# Patient Record
Sex: Female | Born: 1940
Health system: Southern US, Community
[De-identification: ages and names within clinical notes are randomized; demographics above are authoritative.]

## PROBLEM LIST (undated history)

## (undated) DIAGNOSIS — M1990 Unspecified osteoarthritis, unspecified site: Secondary | ICD-10-CM

## (undated) DIAGNOSIS — E079 Disorder of thyroid, unspecified: Secondary | ICD-10-CM

## (undated) DIAGNOSIS — G709 Myoneural disorder, unspecified: Secondary | ICD-10-CM

## (undated) DIAGNOSIS — M810 Age-related osteoporosis without current pathological fracture: Secondary | ICD-10-CM

## (undated) DIAGNOSIS — E785 Hyperlipidemia, unspecified: Secondary | ICD-10-CM

## (undated) HISTORY — DX: Hyperlipidemia, unspecified: E78.5

## (undated) HISTORY — PX: OTHER SURGICAL HISTORY: SHX169

## (undated) HISTORY — DX: Unspecified osteoarthritis, unspecified site: M19.90

## (undated) HISTORY — DX: Disorder of thyroid, unspecified: E07.9

## (undated) HISTORY — DX: Myoneural disorder, unspecified: G70.9

## (undated) HISTORY — DX: Age-related osteoporosis without current pathological fracture: M81.0

---

## 1970-06-13 HISTORY — PX: ECTOPIC PREGNANCY SURGERY: SHX613

## 2010-11-10 ENCOUNTER — Other Ambulatory Visit: Payer: Self-pay | Admitting: *Deleted

## 2010-11-10 DIAGNOSIS — M25551 Pain in right hip: Secondary | ICD-10-CM

## 2010-11-11 ENCOUNTER — Ambulatory Visit
Admission: RE | Admit: 2010-11-11 | Discharge: 2010-11-11 | Disposition: A | Discharge status: Home / Self Care | Payer: Medicare Other | Source: Ambulatory Visit | Attending: *Deleted | Admitting: *Deleted

## 2010-11-11 DIAGNOSIS — M25551 Pain in right hip: Secondary | ICD-10-CM

## 2012-02-09 LAB — CBC AND DIFFERENTIAL
HCT: 37 (ref 36–46)
Hemoglobin: 12.2 (ref 12.0–16.0)
Platelets: 176 (ref 150–399)
WBC: 4.1

## 2012-02-09 LAB — BASIC METABOLIC PANEL
BUN: 17 (ref 4–21)
Creatinine: 0.9 (ref 0.5–1.1)

## 2012-02-09 LAB — HEPATIC FUNCTION PANEL
ALT: 10 (ref 7–35)
AST: 18 (ref 13–35)
Alkaline Phosphatase: 98 (ref 25–125)
Bilirubin, Total: 0.5

## 2012-05-28 ENCOUNTER — Other Ambulatory Visit: Payer: Self-pay | Admitting: Rheumatology

## 2012-05-28 DIAGNOSIS — M549 Dorsalgia, unspecified: Secondary | ICD-10-CM

## 2012-12-19 LAB — LIPID PANEL
Cholesterol: 215 mg/dL — AB (ref 0–200)
HDL: 94 mg/dL — AB (ref 35–70)
LDL Cholesterol: 106 mg/dL
Triglycerides: 76 mg/dL (ref 40–160)

## 2012-12-19 LAB — CBC AND DIFFERENTIAL
HCT: 39 % (ref 36–46)
Hemoglobin: 12.8 g/dL (ref 12.0–16.0)

## 2012-12-19 LAB — BASIC METABOLIC PANEL
BUN: 13 mg/dL (ref 4–21)
Creatinine: 0.9 mg/dL (ref 0.5–1.1)

## 2013-10-14 ENCOUNTER — Ambulatory Visit (INDEPENDENT_AMBULATORY_CARE_PROVIDER_SITE_OTHER): Payer: Medicare Other | Admitting: Podiatry

## 2013-10-14 ENCOUNTER — Encounter: Payer: Self-pay | Admitting: Podiatry

## 2013-10-14 VITALS — BP 155/82 | HR 54 | Resp 15 | Ht 67.0 in | Wt 149.0 lb

## 2013-10-14 DIAGNOSIS — L600 Ingrowing nail: Secondary | ICD-10-CM

## 2013-10-14 NOTE — Patient Instructions (Signed)
Contact [facility] to evaluate skin color change on the distal left great toe.

## 2013-10-14 NOTE — Progress Notes (Signed)
   Subjective:    Patient ID: Courtney Ayala, female    DOB: 07/08/1940, 73 y.o.   MRN: [id number]  HPI Comments: N toenail problems L right 1st toenail, left 1st and 2nd toenails D 6 months right 1st toenail has been tender and loose from the nailbed     2 months left1st and 2nd toenail are discolored and thickened, with a vertical strip and a dark spot at the left 1st toe tip O C right 1st is tender and lifting from the toenail bed A possible polish related T none   Patient states that the skin: The distal left hallux was noted about 2 years ago, however, she thinks that the size is increasing more recently  Review of Systems  Eyes: Positive for pain.  Musculoskeletal: Positive for arthralgias and back pain.  Skin:       Change in nails of the hand texture and color  Allergic/Immunologic: Positive for environmental allergies.  All other systems reviewed and are negative.      Objective:   Physical Exam  Orientated x3 space black female  Vascular: DP and PT pulses 2/4 bilaterally  Neurological: Sensation to 10 g monofilament wire intact 5/5 bilaterally Vibratory sensation intact bilaterally Ankle reflexes reactive bilaterally  Dermatological: A circular black 2 mm pigmented lesion nonraised noted on the distal left hallux. Borders appear regular. There is no bleeding. The distal right hallux toenail demonstrates some mild color changes without any subungual debris. The lateral margin right hallux toenail is mildly tender without any drainage noted.  Musculoskeletal Bilateral HAV deformities noted The right hallux is rubbing against the lateral margin of the second toe which is probably the cause of the patient's occasional discomfort in the area        Assessment & Plan:   Assessment: Satisfactory neurovascular status Pigmented lesion distal left hallux that needs further evaluation Dystrophic changes in the left hallux that may be associated from toenail  polish Ingrowing margin of the left hallux nail  Plan: Patient is advised to contact [facility] as soon as possible to arrange an evaluation of the pigmented lesion on the distal aspect of the left hallux.  No treatment recommended for the nail dystrophic changes and mildly ingrowing left hallux nail.

## 2013-10-15 ENCOUNTER — Encounter: Payer: Self-pay | Admitting: Podiatry

## 2014-06-26 DIAGNOSIS — Z79899 Other long term (current) drug therapy: Secondary | ICD-10-CM | POA: Diagnosis not present

## 2014-06-26 DIAGNOSIS — E785 Hyperlipidemia, unspecified: Secondary | ICD-10-CM | POA: Diagnosis not present

## 2014-06-26 DIAGNOSIS — I10 Essential (primary) hypertension: Secondary | ICD-10-CM | POA: Diagnosis not present

## 2014-06-26 DIAGNOSIS — M150 Primary generalized (osteo)arthritis: Secondary | ICD-10-CM | POA: Diagnosis not present

## 2014-06-26 DIAGNOSIS — E039 Hypothyroidism, unspecified: Secondary | ICD-10-CM | POA: Diagnosis not present

## 2014-06-26 DIAGNOSIS — K219 Gastro-esophageal reflux disease without esophagitis: Secondary | ICD-10-CM | POA: Diagnosis not present

## 2014-09-26 DIAGNOSIS — M150 Primary generalized (osteo)arthritis: Secondary | ICD-10-CM | POA: Diagnosis not present

## 2014-09-26 DIAGNOSIS — E785 Hyperlipidemia, unspecified: Secondary | ICD-10-CM | POA: Diagnosis not present

## 2014-09-26 DIAGNOSIS — I10 Essential (primary) hypertension: Secondary | ICD-10-CM | POA: Diagnosis not present

## 2014-09-26 DIAGNOSIS — K219 Gastro-esophageal reflux disease without esophagitis: Secondary | ICD-10-CM | POA: Diagnosis not present

## 2014-09-26 DIAGNOSIS — Z79899 Other long term (current) drug therapy: Secondary | ICD-10-CM | POA: Diagnosis not present

## 2014-09-26 DIAGNOSIS — E039 Hypothyroidism, unspecified: Secondary | ICD-10-CM | POA: Diagnosis not present

## 2014-09-26 DIAGNOSIS — R739 Hyperglycemia, unspecified: Secondary | ICD-10-CM | POA: Diagnosis not present

## 2014-09-29 LAB — HEMOGLOBIN A1C: Hemoglobin A1C: 5.9

## 2014-12-22 ENCOUNTER — Encounter: Payer: Self-pay | Admitting: Family Medicine

## 2015-02-02 ENCOUNTER — Telehealth: Payer: Self-pay | Admitting: Behavioral Health

## 2015-02-02 NOTE — Telephone Encounter (Signed)
Per the patient, she will call the office a week from now to reschedule for  another appointment.

## 2015-02-03 ENCOUNTER — Ambulatory Visit: Payer: Self-pay | Admitting: Family Medicine

## 2015-02-12 LAB — HM MAMMOGRAPHY: HM Mammogram: NORMAL (ref 0–4)

## 2015-03-12 DIAGNOSIS — E785 Hyperlipidemia, unspecified: Secondary | ICD-10-CM | POA: Diagnosis not present

## 2015-03-12 DIAGNOSIS — K219 Gastro-esophageal reflux disease without esophagitis: Secondary | ICD-10-CM | POA: Diagnosis not present

## 2015-03-12 DIAGNOSIS — Z79899 Other long term (current) drug therapy: Secondary | ICD-10-CM | POA: Diagnosis not present

## 2015-03-12 DIAGNOSIS — E039 Hypothyroidism, unspecified: Secondary | ICD-10-CM | POA: Diagnosis not present

## 2015-03-12 DIAGNOSIS — M150 Primary generalized (osteo)arthritis: Secondary | ICD-10-CM | POA: Diagnosis not present

## 2015-03-12 DIAGNOSIS — I10 Essential (primary) hypertension: Secondary | ICD-10-CM | POA: Diagnosis not present

## 2015-03-12 LAB — HEPATIC FUNCTION PANEL
ALT: 9 U/L (ref 7–35)
AST: 16 U/L (ref 13–35)
Alkaline Phosphatase: 83 U/L (ref 25–125)

## 2015-03-12 LAB — TSH: TSH: 0.94 u[IU]/mL (ref 0.41–5.90)

## 2015-03-12 LAB — BASIC METABOLIC PANEL
BUN: 13 mg/dL (ref 4–21)
Creatinine: 0.8 mg/dL (ref 0.5–1.1)
Potassium: 4.4 mmol/L (ref 3.4–5.3)
Sodium: 141 mmol/L (ref 137–147)

## 2015-03-12 LAB — HEMOGLOBIN A1C: Hemoglobin A1C: 5.9

## 2015-03-13 LAB — HEMOGLOBIN A1C: Hemoglobin A1C: 5.9

## 2015-03-13 LAB — TSH: TSH: 0.94 (ref 0.41–5.90)

## 2015-04-06 DIAGNOSIS — M25561 Pain in right knee: Secondary | ICD-10-CM | POA: Diagnosis not present

## 2015-04-06 DIAGNOSIS — M179 Osteoarthritis of knee, unspecified: Secondary | ICD-10-CM | POA: Diagnosis not present

## 2015-05-06 DIAGNOSIS — Z1231 Encounter for screening mammogram for malignant neoplasm of breast: Secondary | ICD-10-CM | POA: Diagnosis not present

## 2015-05-06 DIAGNOSIS — Z9189 Other specified personal risk factors, not elsewhere classified: Secondary | ICD-10-CM | POA: Diagnosis not present

## 2015-09-08 DIAGNOSIS — E785 Hyperlipidemia, unspecified: Secondary | ICD-10-CM | POA: Diagnosis not present

## 2015-09-08 DIAGNOSIS — K219 Gastro-esophageal reflux disease without esophagitis: Secondary | ICD-10-CM | POA: Diagnosis not present

## 2015-09-08 DIAGNOSIS — E039 Hypothyroidism, unspecified: Secondary | ICD-10-CM | POA: Diagnosis not present

## 2015-09-08 DIAGNOSIS — M150 Primary generalized (osteo)arthritis: Secondary | ICD-10-CM | POA: Diagnosis not present

## 2015-11-23 DIAGNOSIS — M25561 Pain in right knee: Secondary | ICD-10-CM | POA: Diagnosis not present

## 2015-11-23 DIAGNOSIS — M179 Osteoarthritis of knee, unspecified: Secondary | ICD-10-CM | POA: Diagnosis not present

## 2015-12-21 DIAGNOSIS — M25561 Pain in right knee: Secondary | ICD-10-CM | POA: Diagnosis not present

## 2015-12-21 DIAGNOSIS — M179 Osteoarthritis of knee, unspecified: Secondary | ICD-10-CM | POA: Diagnosis not present

## 2016-01-08 DIAGNOSIS — K219 Gastro-esophageal reflux disease without esophagitis: Secondary | ICD-10-CM | POA: Diagnosis not present

## 2016-01-08 DIAGNOSIS — R079 Chest pain, unspecified: Secondary | ICD-10-CM | POA: Diagnosis not present

## 2016-01-08 DIAGNOSIS — E785 Hyperlipidemia, unspecified: Secondary | ICD-10-CM | POA: Diagnosis not present

## 2016-01-08 DIAGNOSIS — E039 Hypothyroidism, unspecified: Secondary | ICD-10-CM | POA: Diagnosis not present

## 2016-01-08 DIAGNOSIS — I10 Essential (primary) hypertension: Secondary | ICD-10-CM | POA: Diagnosis not present

## 2016-01-08 DIAGNOSIS — M150 Primary generalized (osteo)arthritis: Secondary | ICD-10-CM | POA: Diagnosis not present

## 2016-01-08 DIAGNOSIS — Z79899 Other long term (current) drug therapy: Secondary | ICD-10-CM | POA: Diagnosis not present

## 2016-01-09 LAB — HEPATIC FUNCTION PANEL
ALT: 12 (ref 7–35)
AST: 16 (ref 13–35)
Alkaline Phosphatase: 67 (ref 25–125)
Bilirubin, Total: 0.7

## 2016-01-09 LAB — BASIC METABOLIC PANEL
BUN: 13 (ref 4–21)
Creatinine: 0.9 (ref 0.5–1.1)
Glucose: 95
Potassium: 4.2 (ref 3.4–5.3)
Sodium: 142 (ref 137–147)

## 2016-01-09 LAB — LIPID PANEL
Cholesterol: 248 — AB (ref 0–200)
HDL: 137 — AB (ref 35–70)
LDL Cholesterol: 101
Triglycerides: 48 (ref 40–160)

## 2016-01-09 LAB — CBC AND DIFFERENTIAL
HCT: 40 (ref 36–46)
Hemoglobin: 13.1 (ref 12.0–16.0)
WBC: 3.8

## 2016-01-09 LAB — TSH: TSH: 0.45 (ref 0.41–5.90)

## 2016-03-14 DIAGNOSIS — H25813 Combined forms of age-related cataract, bilateral: Secondary | ICD-10-CM | POA: Diagnosis not present

## 2016-03-14 DIAGNOSIS — H04123 Dry eye syndrome of bilateral lacrimal glands: Secondary | ICD-10-CM | POA: Diagnosis not present

## 2016-03-14 DIAGNOSIS — H35363 Drusen (degenerative) of macula, bilateral: Secondary | ICD-10-CM | POA: Diagnosis not present

## 2016-03-30 ENCOUNTER — Encounter: Payer: Self-pay | Admitting: Obstetrics & Gynecology

## 2016-03-30 ENCOUNTER — Ambulatory Visit (INDEPENDENT_AMBULATORY_CARE_PROVIDER_SITE_OTHER): Payer: Medicare Other | Admitting: Obstetrics & Gynecology

## 2016-03-30 DIAGNOSIS — N8110 Cystocele, unspecified: Secondary | ICD-10-CM

## 2016-03-30 NOTE — Patient Instructions (Signed)

## 2016-03-30 NOTE — Progress Notes (Signed)
History:  75 y.o. G1P010 (EC x1) here today for eval of swelling in the vagina that she notes at the end of the day  She reports that it is not present in the am. It was initially noted 6 weeks prev.Pt denies full term pregnancy but, had an ectopic years prev.  Pt does not smoke and does not have a daily cough.  Pt reports occ pain in pelvics but, none related to his 'lump'   Pt denies leakage of urine She has been worried that she has cancer.  Pt had a mammogram last year..   Last Pelvic exam was in her 1750's   The following portions of the patient's history were reviewed and updated as appropriate: allergies, current medications, past family history, past medical history, past social history, past surgical history and problem list.  Review of Systems:  Pertinent items are noted in HPI.  Objective:  Physical Exam Blood pressure (!) 164/81, pulse (!) 57, height 5' 7.5" (1.715 m), weight 148 lb (67.1 kg). Gen: NAD Abd: Soft, nontender and nondistended Pelvic: Normal appearing external genitalia; normal appearing vaginal mucosa and cervix.  Normal discharge.  Small uterus, no other palpable masses, no uterine or adnexal tenderness  Labs and Imaging No results found.  Assessment & Plan:  Pelvic organ prolapse- grade I cystocele and grade I uterine prolapse Reviewed with pt natural history of prolapse.  Reviewed treatment options including pessary and surgery. I rec no treatment at present.  Will have pt f/ui in 6 moths r sooner if she notes changes.  Elevated blood pressure- pt reports that she was worried prior to this visit.  She will f/u with her primary care provider to have her BP rechecked.   Rec f/u in 6 months

## 2016-04-18 ENCOUNTER — Encounter: Payer: Medicare Other | Admitting: Obstetrics & Gynecology

## 2016-05-04 ENCOUNTER — Telehealth: Payer: Self-pay | Admitting: Behavioral Health

## 2016-05-04 ENCOUNTER — Encounter: Payer: Self-pay | Admitting: Behavioral Health

## 2016-05-04 NOTE — Telephone Encounter (Signed)
Pre-Visit Call completed with patient and chart updated.   Pre-Visit Info documented in Specialty Comments under SnapShot.    

## 2016-05-09 ENCOUNTER — Encounter: Payer: Self-pay | Admitting: Family Medicine

## 2016-05-09 ENCOUNTER — Ambulatory Visit (INDEPENDENT_AMBULATORY_CARE_PROVIDER_SITE_OTHER): Payer: Medicare Other | Admitting: Family Medicine

## 2016-05-09 VITALS — BP 128/72 | HR 55 | Temp 98.0°F | Ht 67.4 in | Wt 150.1 lb

## 2016-05-09 DIAGNOSIS — Z23 Encounter for immunization: Secondary | ICD-10-CM | POA: Diagnosis not present

## 2016-05-09 DIAGNOSIS — E785 Hyperlipidemia, unspecified: Secondary | ICD-10-CM | POA: Diagnosis not present

## 2016-05-09 DIAGNOSIS — E890 Postprocedural hypothyroidism: Secondary | ICD-10-CM | POA: Diagnosis not present

## 2016-05-09 NOTE — Progress Notes (Signed)
Pre visit review using our clinic review tool, if applicable. No additional management support is needed unless otherwise documented below in the visit note. 

## 2016-05-09 NOTE — Progress Notes (Signed)
[facility] [address], [address] [place], [place] [id number] [phone] [phone]  Date:  05/09/2016   Name:  Courtney Ayala   DOB:  11/02/1940   MRN:  [id number]  PCP:  Pcp Not In System    Chief Complaint: Establish Care   History of Present Illness:  Courtney MonksMyrtle Gellert is a 75 y.o. very pleasant female patient who presents with the following:  Here today as a new patient to establish care; she recently moved back to this part of the state. Her husband is a pt of Dr. Myna HidalgoEnnever.  [place],    She has OA in her knees.  She uses meloxicam as needed for this problem She also uses thyroid med; she had radioactive iodine for hyperthyroidism a few years ago She is also on cholesterol medication  She last had blood tests about 4 months ago she thinks per her last PCP   Her husband is ill but is making the best of things; he is enjoying life as much as she can She has 2 step- children by her husband's previous marriage.  She herself miscarried once but otherwise has not been able to become pregnant or have children.  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   She would like to get her flu shot today  She had her last mammo in November of 2016- she has this scheduled for next week She is overall feeling well, thinks that her health care is UTD but would like for me to go over her labs, vacs, etc and do any needed updates at our next visit   Patient Active Problem List   Diagnosis Date Noted  . Pelvic organ prolapse quantification stage 1 cystocele 03/30/2016    Past Medical History:  Diagnosis Date  . Arthritis    B/L knees  . Hyperlipidemia   . Neuromuscular disorder (HCC)   . Thyroid disease     Past Surgical History:  Procedure Laterality Date  . ECTOPIC PREGNANCY SURGERY  1972    Social History  Substance Use Topics  . Smoking status: Never  Smoker  . Smokeless tobacco: Never Used  . Alcohol use No    Family History  Problem Relation Age of Onset  . Hypertension Maternal Grandmother   . Cancer Neg Hx   . Stroke Neg Hx   . Diabetes Neg Hx   . Colon cancer Neg Hx   . Breast cancer Neg Hx     No Known Allergies  Medication list has been reviewed and updated.  Current Outpatient Prescriptions on File Prior to Visit  Medication Sig Dispense Refill  . atorvastatin (LIPITOR) 10 MG tablet Take 10 mg by mouth daily.    Marland Kitchen. levothyroxine (SYNTHROID, LEVOTHROID) 88 MCG tablet Take 88 mcg by mouth daily before breakfast.     No current facility-administered medications on file prior to visit.     Review of Systems:  As per HPI- otherwise negative.  Noted mild bradycardia- per pt no CP, SOB, palpitations.  She is quite active but is not sure if she has been bradycardic in the past. However looking back at limited past pulse readings this appears to be her baseline   Pulse Readings from Last 3 Encounters:  05/09/16 (!) 55  03/30/16 (!) 57  10/14/13 (!) 54      Physical Examination: Vitals:  05/09/16 1409  BP: 128/72  Pulse: (!) 55  Temp: 98 F (36.7 C)   Vitals:   05/09/16 1409  Weight: 150 lb 2 oz (68.1 kg)  Height: 5' 7.4" (1.712 m)   Body mass index is 23.23 kg/m. Ideal Body Weight: Weight in (lb) to have BMI = 25: 161.2  GEN: WDWN, NAD, Non-toxic, A & O x 3, looks well HEENT: Atraumatic, Normocephalic. Neck supple. No masses, No LAD. Ears and Nose: No external deformity. CV: RRR, No M/G/R. No JVD. No thrill. No extra heart sounds.  Mild bradycardia PULM: CTA B, no wheezes, crackles, rhonchi. No retractions. No resp. distress. No accessory muscle use. ABD: S, NT, ND, +BS. No rebound. No HSM. EXTR: No c/c/e NEURO Normal gait.  PSYCH: Normally interactive. Conversant. Not depressed or anxious appearing.  Calm demeanor.    Assessment and Plan:  Dyslipidemia Postablative hypothyroidism  Here today  to establish care and go over her health history She is on thyroid replacement and lipid treatment Will request her records from prior PCP and plan to see her in 2-3 months  Signed Abbe AmsterdamJessica Danity Schmelzer, MD

## 2016-05-09 NOTE — Patient Instructions (Addendum)
It was lovely to meet you today We will request records from your last doctor and I will transfer the information into your chart Please come and see me in about 3 months; we can do a physical and update any labs/ immunizations that are needed at that visit

## 2016-06-22 DIAGNOSIS — M25561 Pain in right knee: Secondary | ICD-10-CM | POA: Diagnosis not present

## 2016-06-22 DIAGNOSIS — M179 Osteoarthritis of knee, unspecified: Secondary | ICD-10-CM | POA: Diagnosis not present

## 2016-06-22 DIAGNOSIS — M170 Bilateral primary osteoarthritis of knee: Secondary | ICD-10-CM | POA: Diagnosis not present

## 2016-07-25 DIAGNOSIS — M25561 Pain in right knee: Secondary | ICD-10-CM | POA: Diagnosis not present

## 2016-07-25 DIAGNOSIS — M179 Osteoarthritis of knee, unspecified: Secondary | ICD-10-CM | POA: Diagnosis not present

## 2016-08-18 ENCOUNTER — Telehealth: Payer: Self-pay | Admitting: Family Medicine

## 2016-08-18 ENCOUNTER — Other Ambulatory Visit: Payer: Self-pay | Admitting: Emergency Medicine

## 2016-08-18 MED ORDER — ATORVASTATIN CALCIUM 10 MG PO TABS: 10.0000 mg | ORAL_TABLET | Freq: Every day | ORAL | 0 refills | Status: DC

## 2016-08-18 NOTE — Telephone Encounter (Signed)
Caller name: Diamantina MonksMyrtle Ayala Relationship to patient: self Can be reached: [phone]  Pharmacy: [facility] [address] - [place], [place] - [address] [phone] (Phone) [phone] (Fax)   Reason for call: pt came in office requesting appt for cpe and refill on atorvastatin. Pt is completely out of meds. She took last dose 08/17/16 in evening. [facility] to [facility].

## 2016-08-18 NOTE — Telephone Encounter (Signed)
Refill sent per pt request.  

## 2016-08-22 ENCOUNTER — Telehealth: Payer: Self-pay | Admitting: *Deleted

## 2016-08-22 NOTE — Telephone Encounter (Signed)
Pt states she will schedule awv at a later date.

## 2016-08-24 ENCOUNTER — Encounter: Payer: Self-pay | Admitting: Family Medicine

## 2016-08-24 ENCOUNTER — Other Ambulatory Visit: Payer: Self-pay | Admitting: Family Medicine

## 2016-08-24 ENCOUNTER — Ambulatory Visit (INDEPENDENT_AMBULATORY_CARE_PROVIDER_SITE_OTHER): Payer: Medicare Other | Admitting: Family Medicine

## 2016-08-24 VITALS — BP 160/90 | HR 56 | Temp 98.6°F | Ht 67.4 in | Wt 148.0 lb

## 2016-08-24 DIAGNOSIS — E785 Hyperlipidemia, unspecified: Secondary | ICD-10-CM

## 2016-08-24 DIAGNOSIS — R030 Elevated blood-pressure reading, without diagnosis of hypertension: Secondary | ICD-10-CM

## 2016-08-24 DIAGNOSIS — R7303 Prediabetes: Secondary | ICD-10-CM | POA: Diagnosis not present

## 2016-08-24 DIAGNOSIS — Z1211 Encounter for screening for malignant neoplasm of colon: Secondary | ICD-10-CM | POA: Diagnosis not present

## 2016-08-24 DIAGNOSIS — Z1239 Encounter for other screening for malignant neoplasm of breast: Secondary | ICD-10-CM

## 2016-08-24 DIAGNOSIS — Z1231 Encounter for screening mammogram for malignant neoplasm of breast: Secondary | ICD-10-CM

## 2016-08-24 DIAGNOSIS — Z130 Encounter for screening for diseases of the blood and blood-forming organs and certain disorders involving the immune mechanism: Secondary | ICD-10-CM

## 2016-08-24 DIAGNOSIS — R7309 Other abnormal glucose: Secondary | ICD-10-CM

## 2016-08-24 DIAGNOSIS — E890 Postprocedural hypothyroidism: Secondary | ICD-10-CM

## 2016-08-24 DIAGNOSIS — Z5181 Encounter for therapeutic drug level monitoring: Secondary | ICD-10-CM

## 2016-08-24 MED ORDER — LEVOTHYROXINE SODIUM 88 MCG PO TABS: 88.0000 ug | ORAL_TABLET | Freq: Every day | ORAL | 3 refills | Status: DC

## 2016-08-24 MED ORDER — ATORVASTATIN CALCIUM 10 MG PO TABS: 10.0000 mg | ORAL_TABLET | Freq: Every day | ORAL | 3 refills | Status: DC

## 2016-08-24 NOTE — Progress Notes (Signed)
Pre visit review using our clinic tool,if applicable. No additional management support is needed unless otherwise documented below in the visit note.  

## 2016-08-24 NOTE — Patient Instructions (Addendum)
We will check your labs today and I will be in touch with your resutls asap We will arrange for you to have cologaurd testing to screen for colon cancer  Please stop by the imaging department on the ground floor to schedule your mammogram asap   Please monitor your blood pressure at home and contact me in about 2 weeks with some readings

## 2016-08-24 NOTE — Progress Notes (Addendum)
[facility] [address], [address] [place], [place] [address] [phone]  Date:  08/24/2016   Name:  Courtney Ayala   DOB:  [birth year]   MRN:  [id number]  PCP:  Abbe Amsterdam, MD    Chief Complaint: Annual Exam   History of Present Illness:  Courtney Ayala is a 76 y.o. very pleasant female patient who presents with the following:  Last seen by myself in November for hyperlipidemia. Today would like to check on her progress and do labs as needed She will return for her MWE - RN visit She needs her labs done today Also due for a thyroid level Her last FOBT was a few years ago- she would like cologuard as she is not sure of the date of her last colon cancer screening otherwise  BP Readings from Last 3 Encounters:  08/24/16 (!) 160/90  05/09/16 128/72  03/30/16 (!) 164/81   She has been fasting for the last 8 hours or so Her last bone density was 3-4 years ago She is faithfully taking her thyroid medication  Her BP is elevated today- she is not on BP medication and her BP looked fine at last visit.  She is able to check her BP at home and would like to monitor this for a while prior to considering medication  Noted to have an A1c in pre-diabetes range in 2016- will repeat for her today  Patient Active Problem List   Diagnosis Date Noted  . Postablative hypothyroidism 05/09/2016  . Dyslipidemia 05/09/2016  . Pelvic organ prolapse quantification stage 1 cystocele 03/30/2016    Past Medical History:  Diagnosis Date  . Arthritis    B/L knees  . Hyperlipidemia   . Neuromuscular disorder (HCC)   . Thyroid disease     Past Surgical History:  Procedure Laterality Date  . ECTOPIC PREGNANCY SURGERY  1972    Social History  Substance Use Topics  . Smoking status: Never Smoker  . Smokeless tobacco: Never Used  . Alcohol use No    Family History  Problem Relation Age of Onset  . Hypertension Maternal Grandmother   .  Cancer Neg Hx   . Stroke Neg Hx   . Diabetes Neg Hx   . Colon cancer Neg Hx   . Breast cancer Neg Hx     No Known Allergies  Medication list has been reviewed and updated.  Current Outpatient Prescriptions on File Prior to Visit  Medication Sig Dispense Refill  . meloxicam (MOBIC) 7.5 MG tablet Take 7.5 mg by mouth daily as needed for pain.     No current facility-administered medications on file prior to visit.     Review of Systems:  As per HPI- otherwise negative.   Physical Examination: Vitals:   08/24/16 1401 08/24/16 1625  BP: (!) 174/71 (!) 160/90  Pulse: (!) 56   Temp: 98.6 F (37 C)    Vitals:   08/24/16 1401  Weight: 148 lb (67.1 kg)  Height: 5' 7.4" (1.712 m)   Body mass index is 22.91 kg/m. Ideal Body Weight: Weight in (lb) to have BMI = 25: 161.2  GEN: WDWN, NAD, Non-toxic, A & O x 3, normal weight, looks well HEENT: Atraumatic, Normocephalic. Neck supple. No masses, No LAD.  Bilateral TM wnl, oropharynx normal.  PEERL,EOMI.   Ears and Nose: No external deformity. CV: RRR, No M/G/R. No JVD. No thrill. No extra heart sounds. PULM: CTA B, no wheezes,  crackles, rhonchi. No retractions. No resp. distress. No accessory muscle use. ABD: S, NT, ND EXTR: No c/c/e NEURO Normal gait.  PSYCH: Normally interactive. Conversant. Not depressed or anxious appearing.  Calm demeanor.    Assessment and Plan: Dyslipidemia - Plan: Lipid panel, atorvastatin (LIPITOR) 10 MG tablet  Postablative hypothyroidism - Plan: TSH, levothyroxine (SYNTHROID, LEVOTHROID) 88 MCG tablet  Elevated hemoglobin A1c - Plan: Comprehensive metabolic panel, Hemoglobin A1c  Screening for deficiency anemia - Plan: CBC  Screening for colon cancer  Screening for breast cancer  Medication monitoring encounter - Plan: CBC  Elevated blood pressure reading  Here today for a follow up visit Labs pending as above Refilled her cholesterol and thyroid med cologuard ordered  today Encouraged mammogram asap She will monitor her BP and contact with me some readings in about 2 weeks.  If consistently high will start on a medication likely ace   Signed Abbe AmsterdamJessica Copland, MD  Received her cholesterol numbers- ? Had she been off her cholesterol med.  Will send her a message  Results for orders placed or performed in visit on 08/24/16  CBC  Result Value Ref Range   WBC 4.0 4.0 - 10.5 K/uL   RBC 4.30 3.87 - 5.11 Mil/uL   Platelets 175.0 150.0 - 400.0 K/uL   Hemoglobin 13.9 12.0 - 15.0 g/dL   HCT 29.542.0 62.136.0 - 30.846.0 %   MCV 97.8 78.0 - 100.0 fl   MCHC 33.1 30.0 - 36.0 g/dL   RDW 65.713.5 84.611.5 - 96.215.5 %  Comprehensive metabolic panel  Result Value Ref Range   Sodium 142 135 - 145 mEq/L   Potassium 4.9 3.5 - 5.1 mEq/L   Chloride 106 96 - 112 mEq/L   CO2 28 19 - 32 mEq/L   Glucose, Bld 88 70 - 99 mg/dL   BUN 16 6 - 23 mg/dL   Creatinine, Ser 9.520.89 0.40 - 1.20 mg/dL   Total Bilirubin 0.7 0.2 - 1.2 mg/dL   Alkaline Phosphatase 60 39 - 117 U/L   AST 16 0 - 37 U/L   ALT 10 0 - 35 U/L   Total Protein 7.4 6.0 - 8.3 g/dL   Albumin 4.3 3.5 - 5.2 g/dL   Calcium 84.110.2 8.4 - 32.410.5 mg/dL   GFR 40.1079.02 >27.25>60.00 mL/min  Lipid panel  Result Value Ref Range   Cholesterol 295 (H) 0 - 200 mg/dL   Triglycerides 36.655.0 0.0 - 149.0 mg/dL   HDL 44.0393.50 >47.42>39.00 mg/dL   VLDL 59.511.0 0.0 - 63.840.0 mg/dL   LDL Cholesterol 756191 (H) 0 - 99 mg/dL   Total CHOL/HDL Ratio 3    NonHDL 201.95   TSH  Result Value Ref Range   TSH 0.38 0.35 - 4.50 uIU/mL  Hemoglobin A1c  Result Value Ref Range   Hgb A1c MFr Bld 6.1 4.6 - 6.5 %

## 2016-08-25 LAB — LIPID PANEL
Cholesterol: 295 mg/dL — ABNORMAL HIGH (ref 0–200)
HDL: 93.5 mg/dL (ref >39.00)
LDL Cholesterol: 191 mg/dL — ABNORMAL HIGH (ref 0–99)
NonHDL: 201.95
Total CHOL/HDL Ratio: 3
Triglycerides: 55 mg/dL (ref 0.0–149.0)
VLDL: 11 mg/dL (ref 0.0–40.0)

## 2016-08-25 LAB — COMPREHENSIVE METABOLIC PANEL
ALT: 10 U/L (ref 0–35)
AST: 16 U/L (ref 0–37)
Albumin: 4.3 g/dL (ref 3.5–5.2)
Alkaline Phosphatase: 60 U/L (ref 39–117)
BUN: 16 mg/dL (ref 6–23)
CO2: 28 mEq/L (ref 19–32)
Calcium: 10.2 mg/dL (ref 8.4–10.5)
Chloride: 106 mEq/L (ref 96–112)
Creatinine, Ser: 0.89 mg/dL (ref 0.40–1.20)
GFR: 79.02 mL/min (ref >60.00)
Glucose, Bld: 88 mg/dL (ref 70–99)
Potassium: 4.9 mEq/L (ref 3.5–5.1)
Sodium: 142 mEq/L (ref 135–145)
Total Bilirubin: 0.7 mg/dL (ref 0.2–1.2)
Total Protein: 7.4 g/dL (ref 6.0–8.3)

## 2016-08-25 LAB — CBC
HCT: 42 % (ref 36.0–46.0)
Hemoglobin: 13.9 g/dL (ref 12.0–15.0)
MCHC: 33.1 g/dL (ref 30.0–36.0)
MCV: 97.8 fl (ref 78.0–100.0)
Platelets: 175 10*3/uL (ref 150.0–400.0)
RBC: 4.3 Mil/uL (ref 3.87–5.11)
RDW: 13.5 % (ref 11.5–15.5)
WBC: 4 10*3/uL (ref 4.0–10.5)

## 2016-08-25 LAB — TSH: TSH: 0.38 u[IU]/mL (ref 0.35–4.50)

## 2016-08-25 LAB — HEMOGLOBIN A1C: Hgb A1c MFr Bld: 6.1 % (ref 4.6–6.5)

## 2016-08-26 ENCOUNTER — Encounter: Payer: Self-pay | Admitting: Family Medicine

## 2016-08-27 ENCOUNTER — Encounter: Payer: Self-pay | Admitting: Family Medicine

## 2016-08-27 DIAGNOSIS — R7303 Prediabetes: Secondary | ICD-10-CM | POA: Insufficient documentation

## 2016-09-01 ENCOUNTER — Ambulatory Visit (HOSPITAL_BASED_OUTPATIENT_CLINIC_OR_DEPARTMENT_OTHER)
Admission: RE | Admit: 2016-09-01 | Discharge: 2016-09-01 | Disposition: A | Discharge status: Home / Self Care | Payer: Medicare Other | Source: Ambulatory Visit | Attending: Family Medicine | Admitting: Family Medicine

## 2016-09-01 DIAGNOSIS — Z1231 Encounter for screening mammogram for malignant neoplasm of breast: Secondary | ICD-10-CM

## 2016-09-05 ENCOUNTER — Encounter: Payer: Self-pay | Admitting: Family Medicine

## 2016-09-05 DIAGNOSIS — Z1211 Encounter for screening for malignant neoplasm of colon: Secondary | ICD-10-CM | POA: Diagnosis not present

## 2016-09-05 DIAGNOSIS — Z1212 Encounter for screening for malignant neoplasm of rectum: Secondary | ICD-10-CM | POA: Diagnosis not present

## 2016-09-08 LAB — COLOGUARD

## 2016-09-13 ENCOUNTER — Encounter: Payer: Self-pay | Admitting: Family Medicine

## 2017-02-06 DIAGNOSIS — M353 Polymyalgia rheumatica: Secondary | ICD-10-CM | POA: Diagnosis not present

## 2017-02-06 DIAGNOSIS — M1990 Unspecified osteoarthritis, unspecified site: Secondary | ICD-10-CM | POA: Diagnosis not present

## 2017-02-06 DIAGNOSIS — M545 Low back pain: Secondary | ICD-10-CM | POA: Diagnosis not present

## 2017-02-06 DIAGNOSIS — M25561 Pain in right knee: Secondary | ICD-10-CM | POA: Diagnosis not present

## 2017-02-06 DIAGNOSIS — M179 Osteoarthritis of knee, unspecified: Secondary | ICD-10-CM | POA: Diagnosis not present

## 2017-02-19 NOTE — Progress Notes (Signed)
[facility] [address] [place], [place] [address] [phone]  Date:  02/22/2017   Name:  Courtney Ayala   DOB:  Jan 18, 1941   MRN:  [id number]  PCP:  [name], MD    Chief Complaint: Follow-up (Pt here for 6 month f/u.  )   History of Present Illness:  Courtney Ayala is a 76 y.o. very pleasant female patient who presents with the following:  Here today for a recheck visit- last seen by myself in March of this year:  She needs her labs done today Also due for a thyroid level Her last FOBT was a few years ago- she would like cologuard as she is not sure of the date of her last colon cancer screening otherwise     BP Readings from Last 3 Encounters:  08/24/16 (!) 160/90  05/09/16 128/72  03/30/16 (!) 164/81   She has been fasting for the last 8 hours or so Her last bone density was 3-4 years ago She is faithfully taking her thyroid medication  Her BP is elevated today- she is not on BP medication and her BP looked fine at last visit.  She is able to check her BP at home and would like to monitor this for a while prior to considering medication  Noted to have an A1c in pre-diabetes range in 2016- will repeat for her today  She is due for a bone density, tetanus, pneumonia vaccine today   She had a good summer and was able to get away to the mountains.   She recently started a seniors aerobics and weight training class 3 time a week. She is really enjoying this and is tolerating the class well Her rheumatologist has encouraged her to get a recumbent bike -she does NOT have RA, she has OA Her rheumatologist is Dr. Kathi Ludwig with [facility]  Lab Results  Component Value Date   TSH 0.38 08/24/2016    She did have a bone density test but it has been years  Flu shot today She would like to defer her pneumonia shot today- may do at next visit   Tolerating her lipitor well  Patient Active Problem List   Diagnosis  Date Noted  . Pre-diabetes 08/27/2016  . Postablative hypothyroidism 05/09/2016  . Dyslipidemia 05/09/2016  . Pelvic organ prolapse quantification stage 1 cystocele 03/30/2016    Past Medical History:  Diagnosis Date  . Arthritis    B/L knees  . Hyperlipidemia   . Neuromuscular disorder (HCC)   . Thyroid disease     Past Surgical History:  Procedure Laterality Date  . ECTOPIC PREGNANCY SURGERY  1972    Social History  Substance Use Topics  . Smoking status: Never Smoker  . Smokeless tobacco: Never Used  . Alcohol use No    Family History  Problem Relation Age of Onset  . Hypertension Maternal Grandmother   . Cancer Neg Hx   . Stroke Neg Hx   . Diabetes Neg Hx   . Colon cancer Neg Hx   . Breast cancer Neg Hx     No Known Allergies  Medication list has been reviewed and updated.  Current Outpatient Prescriptions on File Prior to Visit  Medication Sig Dispense Refill  . atorvastatin (LIPITOR) 10 MG tablet Take 1 tablet (10 mg total) by mouth daily. 90 tablet 3  . levothyroxine (SYNTHROID, LEVOTHROID) 88 MCG tablet Take 1 tablet (88 mcg total) by  mouth daily before breakfast. 90 tablet 3  . meloxicam (MOBIC) 7.5 MG tablet Take 7.5 mg by mouth daily as needed for pain.     No current facility-administered medications on file prior to visit.     Review of Systems:  As per HPI- otherwise negative.   Physical Examination: Vitals:   02/22/17 1005 02/22/17 1010  BP: (!) 143/66 131/65  Pulse: (!) 55   Temp: 97.9 F (36.6 C)   SpO2: 100%    Vitals:   02/22/17 1005  Weight: 141 lb 6.4 oz (64.1 kg)  Height: 5' 7.75" (1.721 m)   Body mass index is 21.66 kg/m. Ideal Body Weight: Weight in (lb) to have BMI = 25: 162.9  GEN: WDWN, NAD, Non-toxic, A & O x 3, healthy and youthful appearing lady HEENT: Atraumatic, Normocephalic. Neck supple. No masses, No LAD. Ears and Nose: No external deformity. CV: RRR, No M/G/R. No JVD. No thrill. No extra heart  sounds. PULM: CTA B, no wheezes, crackles, rhonchi. No retractions. No resp. distress. No accessory muscle use. ABD: S, NT, ND, +BS. No rebound. No HSM. EXTR: No c/c/e NEURO Normal gait.  PSYCH: Normally interactive. Conversant. Not depressed or anxious appearing.  Calm demeanor.    Assessment and Plan: Dyslipidemia  Postablative hypothyroidism  Elevated blood pressure reading  Immunization due - Plan: Flu vaccine HIGH DOSE PF (Fluzone High dose)  Here today for a recheck visit Flu shot today We never got her records from her past PCP- will request again Encouraged a bone density scan but she is not sure about this yet Lab Results  Component Value Date   TSH 0.38 08/24/2016   She prefers to wait and recheck her TSH at her CPE in 6 months which is fine Her BP is acceptable Continue lipitor for high cholesterol CPE 6 months    Signed Abbe AmsterdamJessica Copland, MD

## 2017-02-22 ENCOUNTER — Ambulatory Visit (INDEPENDENT_AMBULATORY_CARE_PROVIDER_SITE_OTHER): Payer: Medicare Other | Admitting: Family Medicine

## 2017-02-22 VITALS — BP 131/65 | HR 55 | Temp 97.9°F | Ht 67.75 in | Wt 141.4 lb

## 2017-02-22 DIAGNOSIS — E890 Postprocedural hypothyroidism: Secondary | ICD-10-CM

## 2017-02-22 DIAGNOSIS — E785 Hyperlipidemia, unspecified: Secondary | ICD-10-CM

## 2017-02-22 DIAGNOSIS — R030 Elevated blood-pressure reading, without diagnosis of hypertension: Secondary | ICD-10-CM

## 2017-02-22 DIAGNOSIS — Z23 Encounter for immunization: Secondary | ICD-10-CM

## 2017-02-22 NOTE — Patient Instructions (Addendum)
I will fax your records release again to Dr. Ladona Ridgelaylor It was very nice to see you as always- your exercise program is a great idea!   Please see me in about 6 months for a physical and fasting labs   I would encourage you to get a bone density scan at your convenience- let me know if you would like for me to order this test for you

## 2017-05-09 DIAGNOSIS — M353 Polymyalgia rheumatica: Secondary | ICD-10-CM | POA: Diagnosis not present

## 2017-05-09 DIAGNOSIS — M25561 Pain in right knee: Secondary | ICD-10-CM | POA: Diagnosis not present

## 2017-05-09 DIAGNOSIS — M179 Osteoarthritis of knee, unspecified: Secondary | ICD-10-CM | POA: Diagnosis not present

## 2017-05-09 DIAGNOSIS — M1990 Unspecified osteoarthritis, unspecified site: Secondary | ICD-10-CM | POA: Diagnosis not present

## 2017-05-09 DIAGNOSIS — M545 Low back pain: Secondary | ICD-10-CM | POA: Diagnosis not present

## 2017-05-10 DIAGNOSIS — H35363 Drusen (degenerative) of macula, bilateral: Secondary | ICD-10-CM | POA: Diagnosis not present

## 2017-05-10 DIAGNOSIS — H25813 Combined forms of age-related cataract, bilateral: Secondary | ICD-10-CM | POA: Diagnosis not present

## 2017-05-10 DIAGNOSIS — H4320 Crystalline deposits in vitreous body, unspecified eye: Secondary | ICD-10-CM | POA: Diagnosis not present

## 2017-06-09 ENCOUNTER — Telehealth: Payer: Self-pay | Admitting: *Deleted

## 2017-06-09 NOTE — Telephone Encounter (Signed)
Received Medical records [facility]; forwarded to provider/SLS

## 2017-06-12 ENCOUNTER — Encounter: Payer: Self-pay | Admitting: Family Medicine

## 2017-06-12 ENCOUNTER — Telehealth: Payer: Self-pay | Admitting: Family Medicine

## 2017-06-12 DIAGNOSIS — M810 Age-related osteoporosis without current pathological fracture: Secondary | ICD-10-CM

## 2017-06-12 DIAGNOSIS — M1710 Unilateral primary osteoarthritis, unspecified knee: Secondary | ICD-10-CM

## 2017-06-12 HISTORY — DX: Age-related osteoporosis without current pathological fracture: M81.0

## 2017-06-12 NOTE — Telephone Encounter (Signed)
Received her records from [facility], Dr. Ladona Ridgelaylor. Will abstract and scan as appropriate.

## 2017-06-13 DIAGNOSIS — M179 Osteoarthritis of knee, unspecified: Secondary | ICD-10-CM | POA: Insufficient documentation

## 2017-06-13 DIAGNOSIS — M1710 Unilateral primary osteoarthritis, unspecified knee: Secondary | ICD-10-CM | POA: Insufficient documentation

## 2017-06-15 ENCOUNTER — Telehealth: Payer: Self-pay | Admitting: *Deleted

## 2017-06-15 NOTE — Telephone Encounter (Signed)
Received Medical records from [facility]; forwarded to provider/SLS 01/03

## 2017-06-20 ENCOUNTER — Encounter: Payer: Self-pay | Admitting: Family Medicine

## 2017-06-20 NOTE — Progress Notes (Signed)
CHOL/HDLC RATIO:  1.8 eGFR AFRICAN AMERICAN: 76 CHLORIDE: 107 CARBON DIOXIDE: 26 CALCIUM: 9.6 PROTEIN, TOTAL: 7.1 MCV: 96.9

## 2017-06-20 NOTE — Progress Notes (Signed)
MCV: 95.4

## 2017-08-07 ENCOUNTER — Ambulatory Visit (INDEPENDENT_AMBULATORY_CARE_PROVIDER_SITE_OTHER): Payer: Medicare Other | Admitting: Podiatry

## 2017-08-07 ENCOUNTER — Encounter: Payer: Self-pay | Admitting: Podiatry

## 2017-08-07 DIAGNOSIS — L600 Ingrowing nail: Secondary | ICD-10-CM | POA: Diagnosis not present

## 2017-08-07 DIAGNOSIS — B351 Tinea unguium: Secondary | ICD-10-CM

## 2017-08-07 NOTE — Patient Instructions (Signed)

## 2017-08-07 NOTE — Progress Notes (Signed)
   Subjective:    Patient ID: Courtney Ayala, female    DOB: Nov 12, 1940, [age] y.o.   MRN: [id number]  HPI    Review of Systems  All other systems reviewed and are negative.      Objective:   Physical Exam        Assessment & Plan:

## 2017-08-08 NOTE — Progress Notes (Signed)
Subjective:   Patient ID: Courtney Ayala, female   DOB: 77 y.o.   MRN: [id number]   HPI Patient presents stating she has an irritated toenail of the right hallux and it has gotten better but she wants it checked.  Patient does not smoke and likes to be active   Review of Systems  All other systems reviewed and are negative.       Objective:  Physical Exam  Constitutional: She appears well-developed and well-nourished.  Cardiovascular: Intact distal pulses.  Pulmonary/Chest: Effort normal.  Musculoskeletal: Normal range of motion.  Neurological: She is alert.  Skin: Skin is warm.  Nursing note and vitals reviewed.   Neurovascular status was found to be intact muscle strength was adequate range of motion was within normal limits with the right hallux showing irritation of the lateral border localized in nature with a trauma to the nail bed of the long-term nature.  Patient was noted to have good digital perfusion well oriented x3    Assessment:  Nail disease with incurvated corner with mycotic component also to the nail and traumatized nailbed     Plan:  H&P conditions reviewed and discussed at great length.  We discussed nail removal nail corner removal versus soaks and at this time she is can opt for soaks and Band-Aid therapy and will be seen back if any change in symptoms should occur

## 2017-08-11 ENCOUNTER — Ambulatory Visit: Payer: Medicare Other | Admitting: Podiatry

## 2017-08-22 NOTE — Progress Notes (Addendum)
[facility] [address], [address] [place], [place] [address] [phone]  Date:  08/23/2017   Name:  Courtney Ayala   DOB:  06-18-1940   MRN:  [id number]  PCP:  [name], MD    Chief Complaint: Annual Exam (Pt here for physical with fasting labs. ) and Medication Refill (Pt would refills today. )   History of Present Illness:  Courtney Ayala is a 77 y.o. very pleasant female patient who presents with the following:  6 month follow-up visit today History of hypothyroidism, dyslipidemia, osteoporosis, OA  Lab Results  Component Value Date   TSH 0.38 08/24/2016   Tetanus:  She is really not sure of date, declines today Pneumonia:  She does not think she has had this done.   Declines today mammo due soon Last dexa 4 years ago, she declines for me to schedule this or a mammo today  From our last visit in September: She had a good summer and was able to get away to the mountains.   She recently started a seniors aerobics and weight training class 3 time a week. She is really enjoying this and is tolerating the class well Her rheumatologist has encouraged her to get a recumbent bike -she does NOT have RA, she has OA Her rheumatologist is Dr. Kathi Ludwig with [facility]  Hey plan to travel to [place] next month which is exciting She is still doing her exercise classes.   No CP or SOB with exercising  She is fasting today for labs She is seeing DR. Syed on Monday about her arthritis.     She does check her BP at home on occasion, but not that frequently. She is not sure what her average readings would be.  However it was 118/75 when she checked it last week   BP Readings from Last 3 Encounters:  08/23/17 (!) 158/84  02/22/17 131/65  08/24/16 (!) 160/90    Patient Active Problem List   Diagnosis Date Noted  . Knee osteoarthritis 06/13/2017  . Osteoporosis 06/12/2017  . Pre-diabetes 08/27/2016  . Postablative hypothyroidism  05/09/2016  . Dyslipidemia 05/09/2016  . Pelvic organ prolapse quantification stage 1 cystocele 03/30/2016    Past Medical History:  Diagnosis Date  . Arthritis    B/L knees  . Hyperlipidemia   . Neuromuscular disorder (HCC)   . Osteoporosis 06/12/2017  . Thyroid disease     Past Surgical History:  Procedure Laterality Date  . ECTOPIC PREGNANCY SURGERY  1972    Social History   Tobacco Use  . Smoking status: Never Smoker  . Smokeless tobacco: Never Used  Substance Use Topics  . Alcohol use: No  . Drug use: No    Family History  Problem Relation Age of Onset  . Hypertension Maternal Grandmother   . Cancer Neg Hx   . Stroke Neg Hx   . Diabetes Neg Hx   . Colon cancer Neg Hx   . Breast cancer Neg Hx     No Known Allergies  Medication list has been reviewed and updated.  Current Outpatient Medications on File Prior to Visit  Medication Sig Dispense Refill  . atorvastatin (LIPITOR) 10 MG tablet Take 1 tablet (10 mg total) by mouth daily. 90 tablet 3  . levothyroxine (SYNTHROID, LEVOTHROID) 88 MCG tablet Take 1 tablet (88 mcg total) by mouth daily before breakfast. 90 tablet 3  . meloxicam (MOBIC) 7.5 MG tablet Take 7.5 mg  by mouth daily as needed for pain.     No current facility-administered medications on file prior to visit.     Review of Systems:  As per HPI- otherwise negative. No chest pain or SOB No breast changes No vaginal bleeding   Physical Examination: Vitals:   08/23/17 1013 08/23/17 1016  BP: (!) 152/84 (!) 158/84  Pulse: (!) 52   Temp: 98.4 F (36.9 C)   SpO2: 98%    Vitals:   08/23/17 1013  Weight: 145 lb (65.8 kg)  Height: 5' 6.5" (1.689 m)   Body mass index is 23.05 kg/m. Ideal Body Weight: Weight in (lb) to have BMI = 25: 156.9  GEN: WDWN, NAD, Non-toxic, A & O x 3 HEENT: Atraumatic, Normocephalic. Neck supple. No masses, No LAD. Ears and Nose: No external deformity. CV: RRR, No M/G/R. No JVD. No thrill. No extra heart  sounds. PULM: CTA B, no wheezes, crackles, rhonchi. No retractions. No resp. distress. No accessory muscle use. ABD: S, NT, ND, +BS. No rebound. No HSM. EXTR: No c/c/e NEURO Normal gait.  PSYCH: Normally interactive. Conversant. Not depressed or anxious appearing.  Calm demeanor.    Assessment and Plan: Age-related osteoporosis without current pathological fracture  Dyslipidemia - Plan: Lipid panel, atorvastatin (LIPITOR) 10 MG tablet  Postablative hypothyroidism - Plan: TSH, levothyroxine (SYNTHROID, LEVOTHROID) 88 MCG tablet  Pre-diabetes - Plan: Hemoglobin A1c  Medication monitoring encounter - Plan: CBC, Comprehensive metabolic panel  Office visit today- she has red/white/blue medicare so did not charge for CPE Refills, labs pending as above She will monitor her BP at home and keep me apprised of her numbers She will see me in 6 months in any case and states that she may be willing to have pneumonia shot/ dexa/ mammo at that time  Will plan further follow- up pending labs.    Signed Abbe Amsterdam, MD Received his labs, message to pt  Blood count and metabolic profile look good Cholesterol is fine- continue your lipitor Thyroid is ok- continue current dose of synthroid A1c is still in the pre-diabetes range.   Overall all is well- please see me in 6 months and take care! Results for orders placed or performed in visit on 08/23/17  CBC  Result Value Ref Range   WBC 3.9 (L) 4.0 - 10.5 K/uL   RBC 4.11 3.87 - 5.11 Mil/uL   Platelets 151.0 150.0 - 400.0 K/uL   Hemoglobin 13.4 12.0 - 15.0 g/dL   HCT 16.1 09.6 - 04.5 %   MCV 97.5 78.0 - 100.0 fl   MCHC 33.4 30.0 - 36.0 g/dL   RDW 40.9 81.1 - 91.4 %  Comprehensive metabolic panel  Result Value Ref Range   Sodium 140 135 - 145 mEq/L   Potassium 4.3 3.5 - 5.1 mEq/L   Chloride 104 96 - 112 mEq/L   CO2 30 19 - 32 mEq/L   Glucose, Bld 102 (H) 70 - 99 mg/dL   BUN 19 6 - 23 mg/dL   Creatinine, Ser 7.82 0.40 - 1.20 mg/dL    Total Bilirubin 0.7 0.2 - 1.2 mg/dL   Alkaline Phosphatase 73 39 - 117 U/L   AST 15 0 - 37 U/L   ALT 13 0 - 35 U/L   Total Protein 7.0 6.0 - 8.3 g/dL   Albumin 4.2 3.5 - 5.2 g/dL   Calcium 9.8 8.4 - 95.6 mg/dL   GFR 21.30 >86.57 mL/min  Lipid panel  Result Value Ref Range   Cholesterol  201 (H) 0 - 200 mg/dL   Triglycerides 40.943.0 0.0 - 149.0 mg/dL   HDL 81.1985.60 >14.78>39.00 mg/dL   VLDL 8.6 0.0 - 29.540.0 mg/dL   LDL Cholesterol 621107 (H) 0 - 99 mg/dL   Total CHOL/HDL Ratio 2    NonHDL 115.31   TSH  Result Value Ref Range   TSH 0.39 0.35 - 4.50 uIU/mL  Hemoglobin A1c  Result Value Ref Range   Hgb A1c MFr Bld 6.2 4.6 - 6.5 %

## 2017-08-23 ENCOUNTER — Encounter: Payer: Self-pay | Admitting: Family Medicine

## 2017-08-23 ENCOUNTER — Ambulatory Visit (INDEPENDENT_AMBULATORY_CARE_PROVIDER_SITE_OTHER): Payer: Medicare Other | Admitting: Family Medicine

## 2017-08-23 VITALS — BP 152/80 | HR 52 | Temp 98.4°F | Ht 66.5 in | Wt 145.0 lb

## 2017-08-23 DIAGNOSIS — R7303 Prediabetes: Secondary | ICD-10-CM

## 2017-08-23 DIAGNOSIS — E785 Hyperlipidemia, unspecified: Secondary | ICD-10-CM | POA: Diagnosis not present

## 2017-08-23 DIAGNOSIS — Z5181 Encounter for therapeutic drug level monitoring: Secondary | ICD-10-CM | POA: Diagnosis not present

## 2017-08-23 DIAGNOSIS — R030 Elevated blood-pressure reading, without diagnosis of hypertension: Secondary | ICD-10-CM

## 2017-08-23 DIAGNOSIS — M810 Age-related osteoporosis without current pathological fracture: Secondary | ICD-10-CM

## 2017-08-23 DIAGNOSIS — E890 Postprocedural hypothyroidism: Secondary | ICD-10-CM | POA: Diagnosis not present

## 2017-08-23 LAB — LIPID PANEL
Cholesterol: 201 mg/dL — ABNORMAL HIGH (ref 0–200)
HDL: 85.6 mg/dL (ref >39.00)
LDL Cholesterol: 107 mg/dL — ABNORMAL HIGH (ref 0–99)
NonHDL: 115.31
Total CHOL/HDL Ratio: 2
Triglycerides: 43 mg/dL (ref 0.0–149.0)
VLDL: 8.6 mg/dL (ref 0.0–40.0)

## 2017-08-23 LAB — CBC
HCT: 40 % (ref 36.0–46.0)
Hemoglobin: 13.4 g/dL (ref 12.0–15.0)
MCHC: 33.4 g/dL (ref 30.0–36.0)
MCV: 97.5 fl (ref 78.0–100.0)
Platelets: 151 10*3/uL (ref 150.0–400.0)
RBC: 4.11 Mil/uL (ref 3.87–5.11)
RDW: 13.3 % (ref 11.5–15.5)
WBC: 3.9 10*3/uL — ABNORMAL LOW (ref 4.0–10.5)

## 2017-08-23 LAB — COMPREHENSIVE METABOLIC PANEL
ALT: 13 U/L (ref 0–35)
AST: 15 U/L (ref 0–37)
Albumin: 4.2 g/dL (ref 3.5–5.2)
Alkaline Phosphatase: 73 U/L (ref 39–117)
BUN: 19 mg/dL (ref 6–23)
CO2: 30 mEq/L (ref 19–32)
Calcium: 9.8 mg/dL (ref 8.4–10.5)
Chloride: 104 mEq/L (ref 96–112)
Creatinine, Ser: 0.95 mg/dL (ref 0.40–1.20)
GFR: 73.1 mL/min (ref >60.00)
Glucose, Bld: 102 mg/dL — ABNORMAL HIGH (ref 70–99)
Potassium: 4.3 mEq/L (ref 3.5–5.1)
Sodium: 140 mEq/L (ref 135–145)
Total Bilirubin: 0.7 mg/dL (ref 0.2–1.2)
Total Protein: 7 g/dL (ref 6.0–8.3)

## 2017-08-23 LAB — HEMOGLOBIN A1C: Hgb A1c MFr Bld: 6.2 % (ref 4.6–6.5)

## 2017-08-23 LAB — TSH: TSH: 0.39 u[IU]/mL (ref 0.35–4.50)

## 2017-08-23 MED ORDER — ATORVASTATIN CALCIUM 10 MG PO TABS: 10.0000 mg | ORAL_TABLET | Freq: Every day | ORAL | 3 refills | Status: DC

## 2017-08-23 MED ORDER — LEVOTHYROXINE SODIUM 88 MCG PO TABS: 88.0000 ug | ORAL_TABLET | Freq: Every day | ORAL | 3 refills | Status: DC

## 2017-08-23 NOTE — Patient Instructions (Addendum)
It looks like you are due for your pneumonia vaccines, as well as a tetanus shot.   Also, you are due for a mammogram and bone density scan.   We can discuss these again in 6 months unless you change your mind and wish to do sooner!  I will check your labs today and will be in touch with your results asap   Please do monitor your BP at home- perhaps check it every other day for 2 weeks and then send me a list of your readings.

## 2017-08-28 DIAGNOSIS — M353 Polymyalgia rheumatica: Secondary | ICD-10-CM | POA: Diagnosis not present

## 2017-08-28 DIAGNOSIS — M1990 Unspecified osteoarthritis, unspecified site: Secondary | ICD-10-CM | POA: Diagnosis not present

## 2017-08-28 DIAGNOSIS — M179 Osteoarthritis of knee, unspecified: Secondary | ICD-10-CM | POA: Diagnosis not present

## 2017-08-28 DIAGNOSIS — M810 Age-related osteoporosis without current pathological fracture: Secondary | ICD-10-CM | POA: Diagnosis not present

## 2017-08-28 DIAGNOSIS — M25561 Pain in right knee: Secondary | ICD-10-CM | POA: Diagnosis not present

## 2017-08-28 DIAGNOSIS — M545 Low back pain: Secondary | ICD-10-CM | POA: Diagnosis not present

## 2017-08-28 DIAGNOSIS — M8580 Other specified disorders of bone density and structure, unspecified site: Secondary | ICD-10-CM | POA: Diagnosis not present

## 2017-09-06 ENCOUNTER — Other Ambulatory Visit: Payer: Self-pay | Admitting: Family Medicine

## 2017-09-07 ENCOUNTER — Ambulatory Visit (INDEPENDENT_AMBULATORY_CARE_PROVIDER_SITE_OTHER): Payer: Medicare Other | Admitting: Podiatry

## 2017-09-07 ENCOUNTER — Encounter: Payer: Self-pay | Admitting: Podiatry

## 2017-09-07 DIAGNOSIS — L03031 Cellulitis of right toe: Secondary | ICD-10-CM | POA: Diagnosis not present

## 2017-09-07 NOTE — Patient Instructions (Signed)

## 2017-09-08 NOTE — Progress Notes (Signed)
Subjective:   Patient ID: Courtney Ayala, female   DOB: 77 y.o.   MRN: [id number]   HPI Patient states she has continued irritation in his right hallux nail and that it making it hard for her to wear shoe gear comfortably.  It has bothered her periodically over the last few months and recently has become more sore.  Her A1c is excellent at 5.7   ROS      Objective:  Physical Exam  Neurovascular status found to be intact muscle strength is adequate with patient right hallux lateral border found to be slightly red with localized drainage and no proximal edema erythema or drainage noted     Assessment:  Localized paronychia infection right hallux lateral border     Plan:  H&P condition reviewed and at this point I went ahead and I infiltrated the right hallux 60 mg Xylocaine Marcaine mixture under sterile conditions are removed the lateral border I cleaned the border out to allow drainage and applied sterile dressing and gave instructions on soaks.  This may require permanent procedure in future and I did explain that to her but at this time are to try this conservative treatment and see if that will solve her problem

## 2017-09-12 ENCOUNTER — Telehealth: Payer: Self-pay

## 2017-09-12 NOTE — Telephone Encounter (Signed)
Pt dropped off daily BP readings from the last 2 weeks- placed in MD red folder for review.

## 2017-09-13 NOTE — Telephone Encounter (Signed)
BP numbers 135/75 127/67 134/73 119/72 [id number] 117/69 126/79 140/77  Pulse in the 50s always  Reassuring, will not start medication.  Called pt back and LMOM, thanked her for readings, no need for meds.

## 2018-01-12 ENCOUNTER — Other Ambulatory Visit: Payer: Self-pay

## 2018-02-17 NOTE — Progress Notes (Signed)
[facility] [address] [place], [place] [address] [phone]  Date:  02/26/2018   Name:  Courtney Ayala   DOB:  Feb 10, 1941   MRN:  [id number]  PCP:  [name], MD    Chief Complaint: Dyslipidemia (6 month follow up); Immunizations (flu shot- lab work prior to getting flu shot to make sure its ok?); and Medication Management (taking vitamins, does she need to increase?)   History of Present Illness:  Courtney Ayala is a 77 y.o. very pleasant female patient who presents with the following:  Following up today History of pre-diabetes, dyslipidemia Last seen here in March: They plan to travel to [place] next month which is exciting She is still doing her exercise classes.   No CP or SOB with exercising  She is fasting today for labs She is seeing DR. Syed on Monday about her arthritis.    She does check her BP at home on occasion, but not that frequently. She is not sure what her average readings would be.  However it was 118/75 when she checked it last week //////////////////////////// She will monitor her BP at home and keep me apprised of her numbers She will see me in 6 months in any case and states that she may be willing to have pneumonia shot/ dexa/ mammo at that time  She did go on her trip to [place] and had a good time, and her summer was nice as well Mammo: 3/18- she will get done next spring  Dexa: done per her rheumatologist Dr. Kathi Ludwig  Flu: today  Pneumonia vaccine: today after discussion with her   She wonders about her vitamins-  I suggested that she add calcium and vitamin D to her regimen  Patient Active Problem List   Diagnosis Date Noted  . Knee osteoarthritis 06/13/2017  . Osteoporosis 06/12/2017  . Pre-diabetes 08/27/2016  . Postablative hypothyroidism 05/09/2016  . Dyslipidemia 05/09/2016  . Pelvic organ prolapse quantification stage 1 cystocele 03/30/2016    Past Medical History:   Diagnosis Date  . Arthritis    B/L knees  . Hyperlipidemia   . Neuromuscular disorder (HCC)   . Osteoporosis 06/12/2017  . Thyroid disease     Past Surgical History:  Procedure Laterality Date  . ECTOPIC PREGNANCY SURGERY  1972    Social History   Tobacco Use  . Smoking status: Never Smoker  . Smokeless tobacco: Never Used  Substance Use Topics  . Alcohol use: No  . Drug use: No    Family History  Problem Relation Age of Onset  . Hypertension Maternal Grandmother   . Cancer Neg Hx   . Stroke Neg Hx   . Diabetes Neg Hx   . Colon cancer Neg Hx   . Breast cancer Neg Hx     No Known Allergies  Medication list has been reviewed and updated.  Current Outpatient Medications on File Prior to Visit  Medication Sig Dispense Refill  . atorvastatin (LIPITOR) 10 MG tablet Take 1 tablet (10 mg total) by mouth daily. 90 tablet 3  . Calcium Carbonate (CALCIUM 600 PO) Take 600 mg by mouth.    . Ferrous Sulfate (IRON) 325 (65 Fe) MG TABS Take by mouth.    . levothyroxine (SYNTHROID, LEVOTHROID) 88 MCG tablet Take 1 tablet (88 mcg total) by mouth daily before breakfast. 90 tablet 3  . Omega-3 Fatty Acids (FISH OIL) 1000 MG CAPS Take 1,000 mg by  mouth.     No current facility-administered medications on file prior to visit.     Review of Systems:  As per HPI- otherwise negative. No fever or chills    Physical Examination: Vitals:   02/26/18 1411  BP: 126/80  Pulse: 64  Resp: 16  Temp: 97.8 F (36.6 C)  SpO2: 97%   Vitals:   02/26/18 1411  Weight: 139 lb (63 kg)  Height: 5' 6.5" (1.689 m)   Body mass index is 22.1 kg/m. Ideal Body Weight: Weight in (lb) to have BMI = 25: 156.9  GEN: WDWN, NAD, Non-toxic, A & O x 3, normal weight, looks well  HEENT: Atraumatic, Normocephalic. Neck supple. No masses, No LAD.  Bilateral TM wnl, oropharynx normal.  PEERL,EOMI.   Ears and Nose: No external deformity. CV: RRR, No M/G/R. No JVD. No thrill. No extra heart  sounds. PULM: CTA B, no wheezes, crackles, rhonchi. No retractions. No resp. distress. No accessory muscle use. EXTR: No c/c/e NEURO Normal gait.  PSYCH: Normally interactive. Conversant. Not depressed or anxious appearing.  Calm demeanor.    Assessment and Plan: Medication monitoring encounter - Plan: CBC, Basic metabolic panel  Immunization due - Plan: Pneumococcal polysaccharide vaccine 23-valent greater than or equal to 2yo subcutaneous/IM, Flu vaccine HIGH DOSE PF (Fluzone High dose)  Pre-diabetes  Following up today Flu shot and pneumonia vaccine given Discussed her pre- diabetes today; continue to follow a healthy diet and exercise Plan to visit in 6 months Labs pending as above   Signed Abbe Amsterdam, MD

## 2018-02-26 ENCOUNTER — Encounter: Payer: Self-pay | Admitting: Family Medicine

## 2018-02-26 ENCOUNTER — Ambulatory Visit (INDEPENDENT_AMBULATORY_CARE_PROVIDER_SITE_OTHER): Payer: Medicare Other | Admitting: Family Medicine

## 2018-02-26 ENCOUNTER — Ambulatory Visit: Payer: Medicare Other | Admitting: *Deleted

## 2018-02-26 VITALS — BP 126/80 | HR 64 | Temp 97.8°F | Resp 16 | Ht 66.5 in | Wt 139.0 lb

## 2018-02-26 DIAGNOSIS — Z5181 Encounter for therapeutic drug level monitoring: Secondary | ICD-10-CM | POA: Diagnosis not present

## 2018-02-26 DIAGNOSIS — Z23 Encounter for immunization: Secondary | ICD-10-CM

## 2018-02-26 DIAGNOSIS — R7303 Prediabetes: Secondary | ICD-10-CM

## 2018-02-26 NOTE — Patient Instructions (Addendum)
I might suggest that you see [facility] for your eye care  Address: [phone], [place], [place] [address]  Phone: [phone]  I will be in touch with your labs asap You do have pre-diabetes, but for the time being we will just monitor this  Continue to eat a healthy diet without too many sweets or junk food, and exercise as you are doing  You got your flu shot and first pneumonia vaccine today- booster due in one year  Always good to see you!  Please schedule a physical with me in 6 months

## 2018-02-28 ENCOUNTER — Ambulatory Visit: Payer: Medicare Other | Admitting: Family Medicine

## 2018-03-04 ENCOUNTER — Encounter: Payer: Self-pay | Admitting: Family Medicine

## 2018-03-20 DIAGNOSIS — M353 Polymyalgia rheumatica: Secondary | ICD-10-CM | POA: Diagnosis not present

## 2018-03-20 DIAGNOSIS — R202 Paresthesia of skin: Secondary | ICD-10-CM | POA: Diagnosis not present

## 2018-03-20 DIAGNOSIS — M25561 Pain in right knee: Secondary | ICD-10-CM | POA: Diagnosis not present

## 2018-03-20 DIAGNOSIS — M8580 Other specified disorders of bone density and structure, unspecified site: Secondary | ICD-10-CM | POA: Diagnosis not present

## 2018-03-20 DIAGNOSIS — M179 Osteoarthritis of knee, unspecified: Secondary | ICD-10-CM | POA: Diagnosis not present

## 2018-03-20 DIAGNOSIS — M1990 Unspecified osteoarthritis, unspecified site: Secondary | ICD-10-CM | POA: Diagnosis not present

## 2018-03-20 DIAGNOSIS — M545 Low back pain: Secondary | ICD-10-CM | POA: Diagnosis not present

## 2018-04-20 ENCOUNTER — Encounter: Payer: Medicare Other | Admitting: Neurology

## 2018-07-11 DIAGNOSIS — H25813 Combined forms of age-related cataract, bilateral: Secondary | ICD-10-CM | POA: Diagnosis not present

## 2018-07-11 DIAGNOSIS — H4320 Crystalline deposits in vitreous body, unspecified eye: Secondary | ICD-10-CM | POA: Diagnosis not present

## 2018-08-13 ENCOUNTER — Ambulatory Visit (INDEPENDENT_AMBULATORY_CARE_PROVIDER_SITE_OTHER): Payer: Medicare Other | Admitting: Family Medicine

## 2018-08-13 ENCOUNTER — Encounter: Payer: Self-pay | Admitting: Family Medicine

## 2018-08-13 VITALS — BP 126/80 | HR 55 | Temp 98.2°F | Resp 16 | Ht 66.5 in | Wt 142.0 lb

## 2018-08-13 DIAGNOSIS — R7303 Prediabetes: Secondary | ICD-10-CM

## 2018-08-13 DIAGNOSIS — J209 Acute bronchitis, unspecified: Secondary | ICD-10-CM

## 2018-08-13 DIAGNOSIS — E890 Postprocedural hypothyroidism: Secondary | ICD-10-CM | POA: Diagnosis not present

## 2018-08-13 DIAGNOSIS — Z1239 Encounter for other screening for malignant neoplasm of breast: Secondary | ICD-10-CM | POA: Diagnosis not present

## 2018-08-13 DIAGNOSIS — R0989 Other specified symptoms and signs involving the circulatory and respiratory systems: Secondary | ICD-10-CM

## 2018-08-13 MED ORDER — DOXYCYCLINE HYCLATE 100 MG PO CAPS: 100.0000 mg | ORAL_CAPSULE | Freq: Two times a day (BID) | ORAL | 0 refills | Status: DC

## 2018-08-13 MED FILL — DOXYCYCLINE HYCLATE 100 MG: 100 | 10 days supply | Qty: 20 | Fill #0

## 2018-08-13 NOTE — Patient Instructions (Addendum)
We are going to treat you for bronchitis with doxycycline  I think you are ok to attend your cataract evaluation next week   We will get your blood this coming Thursday at 11  I could not get your mammogram order signed as it says your Medicare will not cover this screening I would ask you to call the Breast Center and see if they can help   Address: [address], [place], [place] [address] Phone: [phone]

## 2018-08-13 NOTE — Progress Notes (Signed)
[facility] [address], [address] [place], [place] [address] [phone]  Date:  08/13/2018   Name:  Courtney Ayala   DOB:  10-22-1940   MRN:  [id number]  PCP:  [name], MD    Chief Complaint: URI (uri/cold for 2 weeks,fever off and on, chills, cough, rhinorrhea, headaches)   History of Present Illness:  Courtney Ayala is a 78 y.o. very pleasant female patient who presents with the following:  Patient with history of prediabetes, hypothyroidism, dyslipidemia.  Here today for sick visit  Last seen by myself in September for her routine follow-up, at which time she was doing well Today she notes that she has been sick for about 2.5 weeks; she thinks she may have picked up something when she accompanied her husband to the hospital.  He has cancer, his condition is fair  She describes her symptoms as a cold that just will not go away  She has a cough, sneeze, nose running, no fever measured but she has felt hot sometimes She may cough up some phlegm  No vomiting or diarrhea This is her first visit to care for this illness  Lab Results  Component Value Date   HGBA1C 6.2 08/23/2017   Can repeat her A1c today- however the lab is closed so we cannot draw blood today.  She will return on Thursday for blood draw, her husband also has an appointment that day Lab Results  Component Value Date   TSH 0.39 08/23/2017  She is also due for screening mammogram  Patient Active Problem List   Diagnosis Date Noted  . Knee osteoarthritis 06/13/2017  . Osteoporosis 06/12/2017  . Pre-diabetes 08/27/2016  . Postablative hypothyroidism 05/09/2016  . Dyslipidemia 05/09/2016  . Pelvic organ prolapse quantification stage 1 cystocele 03/30/2016    Past Medical History:  Diagnosis Date  . Arthritis    B/L knees  . Hyperlipidemia   . Neuromuscular disorder (HCC)   . Osteoporosis 06/12/2017  . Thyroid disease     Past Surgical  History:  Procedure Laterality Date  . ECTOPIC PREGNANCY SURGERY  1972    Social History   Tobacco Use  . Smoking status: Never Smoker  . Smokeless tobacco: Never Used  Substance Use Topics  . Alcohol use: No  . Drug use: No    Family History  Problem Relation Age of Onset  . Hypertension Maternal Grandmother   . Cancer Neg Hx   . Stroke Neg Hx   . Diabetes Neg Hx   . Colon cancer Neg Hx   . Breast cancer Neg Hx     Not on File  Medication list has been reviewed and updated.  Current Outpatient Medications on File Prior to Visit  Medication Sig Dispense Refill  . atorvastatin (LIPITOR) 10 MG tablet Take 1 tablet (10 mg total) by mouth daily. 90 tablet 3  . Calcium Carbonate (CALCIUM 600 PO) Take 600 mg by mouth.    . Ferrous Sulfate (IRON) 325 (65 Fe) MG TABS Take by mouth.    . levothyroxine (SYNTHROID, LEVOTHROID) 88 MCG tablet Take 1 tablet (88 mcg total) by mouth daily before breakfast. 90 tablet 3  . Omega-3 Fatty Acids (FISH OIL) 1000 MG CAPS Take 1,000 mg by mouth.     No current facility-administered medications on file prior to visit.     Review of Systems:  As per HPI- otherwise negative.  She is taking robitussin, OTC meds  as needed but not a lot  Physical Examination: Vitals:   08/13/18 1640  BP: 126/80  Pulse: (!) 55  Resp: 16  Temp: 98.2 F (36.8 C)  SpO2: 97%   Vitals:   08/13/18 1640  Weight: 142 lb (64.4 kg)  Height: 5' 6.5" (1.689 m)   Body mass index is 22.58 kg/m. Ideal Body Weight: Weight in (lb) to have BMI = 25: 156.9  GEN: WDWN, NAD, Non-toxic, A & O x 3, slim build, looks well HEENT: Atraumatic, Normocephalic. Neck supple. No masses, No LAD.  Bilateral TM wnl, oropharynx normal.  PEERL,EOMI.   Ears and Nose: No external deformity. CV: RRR, No M/G/R. No JVD. No thrill. No extra heart sounds. PULM: CTA B, no wheezes, crackles, rhonchi. No retractions. No resp. distress. No accessory muscle use. ABD: S, NT, ND EXTR: No  c/c/e NEURO Normal gait.  PSYCH: Normally interactive. Conversant. Not depressed or anxious appearing.  Calm demeanor.    Assessment and Plan: Acute bronchitis, unspecified organism - Plan: doxycycline (VIBRAMYCIN) 100 MG capsule  Pre-diabetes - Plan: Hemoglobin A1c  Postablative hypothyroidism - Plan: TSH  Chest congestion  Screening for breast cancer  Here today with concern of cough and bronchitis for 1 to 2 weeks.   Will treat with doxycycline for 10 days, she will let me know if not feeling better in the next few days, sooner if worse I ordered an A1c and TSH for her, she will have her blood drawn in a couple of days. I tried to order a mammogram, but could not Ordered as it says Medicare would not cover.  I advised her to call the breast center and see if they can order this for her Signed Abbe Amsterdam, MD

## 2018-08-16 ENCOUNTER — Other Ambulatory Visit (INDEPENDENT_AMBULATORY_CARE_PROVIDER_SITE_OTHER): Payer: Medicare Other

## 2018-08-16 ENCOUNTER — Encounter: Payer: Self-pay | Admitting: Family Medicine

## 2018-08-16 DIAGNOSIS — R7303 Prediabetes: Secondary | ICD-10-CM | POA: Diagnosis not present

## 2018-08-16 DIAGNOSIS — E890 Postprocedural hypothyroidism: Secondary | ICD-10-CM

## 2018-08-16 LAB — HEMOGLOBIN A1C: Hgb A1c MFr Bld: 6.2 % (ref 4.6–6.5)

## 2018-08-16 LAB — TSH: TSH: 0.36 u[IU]/mL (ref 0.35–4.50)

## 2018-08-16 NOTE — Progress Notes (Signed)
Received her labs  Results for orders placed or performed in visit on 08/16/18  TSH  Result Value Ref Range   TSH 0.36 0.35 - 4.50 uIU/mL  Hemoglobin A1c  Result Value Ref Range   Hgb A1c MFr Bld 6.2 4.6 - 6.5 %   A1c stable, TSH in range  Message to pt

## 2018-08-27 ENCOUNTER — Ambulatory Visit: Payer: Medicare Other | Admitting: Family Medicine

## 2018-08-30 ENCOUNTER — Ambulatory Visit: Payer: Medicare Other | Admitting: Family Medicine

## 2018-09-19 ENCOUNTER — Other Ambulatory Visit: Payer: Self-pay | Admitting: Family Medicine

## 2018-09-19 DIAGNOSIS — E785 Hyperlipidemia, unspecified: Secondary | ICD-10-CM

## 2018-09-19 DIAGNOSIS — E890 Postprocedural hypothyroidism: Secondary | ICD-10-CM

## 2018-09-19 MED ORDER — ATORVASTATIN CALCIUM 10 MG PO TABS: 10.0000 mg | ORAL_TABLET | Freq: Every day | ORAL | 1 refills | Status: DC

## 2018-09-19 MED ORDER — LEVOTHYROXINE SODIUM 88 MCG PO TABS: 88.0000 ug | ORAL_TABLET | Freq: Every day | ORAL | 1 refills | Status: DC

## 2018-09-19 NOTE — Telephone Encounter (Signed)
Refills sent to pharmacy. 

## 2018-09-19 NOTE — Telephone Encounter (Signed)
Patient left voicemail message to request a refill of Lipitor and Synthroid.

## 2018-10-09 ENCOUNTER — Encounter: Payer: Self-pay | Admitting: Family Medicine

## 2018-10-09 NOTE — Progress Notes (Signed)
[facility] at [facility] [address], [address] [name], [place] [address] [phone]  Date:  10/10/2018   Name:  Courtney Ayala   DOB:  Mar 02, 1941   MRN:  [id number]  PCP:  [name], MD    Chief Complaint: No chief complaint on file.   History of Present Illness:  Courtney Ayala is a 78 y.o. very pleasant female patient who presents with the following:  Virtual visit today Pt location is home Provider location is home  Pt ID confirmed with name and DOB- she gives consent for virtual visit today  History of prediabetes, dyslipidemia, post ablative hypothyroidism We updated her A1c and TSH last month, when she was in for sick visit; thyroid in range, A1c stable in prediabetes range  She wondered about medication for pre-diabetes - we discussed using metformin, she wishes to think about this  She notes that her BP went up a bit recently; she got upset due to life circumstances.  Her husband has cancer which is weighing on her.  He has been skipping his oncology visits which upsets her She got a BP of 150/"something" when she was feeling so stressed  However this am her BP looked ok-she checked it upon awakening  She is not able to exercise like she normally does.  She enjoys going to the gym, but it is closed  BP Readings from Last 3 Encounters:  08/13/18 126/80  02/26/18 126/80  08/23/17 (!) 152/80     Lab Results  Component Value Date   TSH 0.36 08/16/2018   Lab Results  Component Value Date   HGBA1C 6.2 08/16/2018     Patient Active Problem List   Diagnosis Date Noted  . Knee osteoarthritis 06/13/2017  . Osteoporosis 06/12/2017  . Pre-diabetes 08/27/2016  . Postablative hypothyroidism 05/09/2016  . Dyslipidemia 05/09/2016  . Pelvic organ prolapse quantification stage 1 cystocele 03/30/2016    Past Medical History:  Diagnosis Date  . Arthritis    B/L knees  . Hyperlipidemia   . Neuromuscular disorder (HCC)    . Osteoporosis 06/12/2017  . Thyroid disease     Past Surgical History:  Procedure Laterality Date  . ECTOPIC PREGNANCY SURGERY  1972    Social History   Tobacco Use  . Smoking status: Never Smoker  . Smokeless tobacco: Never Used  Substance Use Topics  . Alcohol use: No  . Drug use: No    Family History  Problem Relation Age of Onset  . Hypertension Maternal Grandmother   . Cancer Neg Hx   . Stroke Neg Hx   . Diabetes Neg Hx   . Colon cancer Neg Hx   . Breast cancer Neg Hx     No Known Allergies  Medication list has been reviewed and updated.  Current Outpatient Medications on File Prior to Visit  Medication Sig Dispense Refill  . atorvastatin (LIPITOR) 10 MG tablet Take 1 tablet (10 mg total) by mouth daily. 90 tablet 1  . Calcium Carbonate (CALCIUM 600 PO) Take 600 mg by mouth.    . doxycycline (VIBRAMYCIN) 100 MG capsule Take 1 capsule (100 mg total) by mouth 2 (two) times daily. 20 capsule 0  . Ferrous Sulfate (IRON) 325 (65 Fe) MG TABS Take by mouth.    . levothyroxine (SYNTHROID, LEVOTHROID) 88 MCG tablet Take 1 tablet (88 mcg total) by mouth daily before breakfast. 90 tablet 1  . Omega-3 Fatty Acids (FISH OIL) 1000 MG CAPS  Take 1,000 mg by mouth.     No current facility-administered medications on file prior to visit.     Review of Systems:  As per HPI- otherwise negative.   Physical Examination: There were no vitals filed for this visit. There were no vitals filed for this visit. There is no height or weight on file to calculate BMI. Ideal Body Weight:    Spoke to pt over the phone She sounds well, no cough or wheezing is noted   Assessment and Plan: Pre-diabetes  Stress and adjustment reaction  Borderline blood pressure   Encouraged her to work on home exercise for stress reduction; suggested walking outdoors or doing home videos.  She is not able to go to her gym as she likes to do - this is a good stress reducer for her.   She will  continue to monitor her blood pressure-we will let me know if persistent elevation  We discussed metformin today, she does not want to start this medication now but will keep it in mind  I asked her to see me in person this fall, we can do a complete lab panel and check her blood pressure been  We spoke on the telephone for 8 minutes Signed Abbe AmsterdamJessica Jung Yurchak, MD

## 2018-10-10 ENCOUNTER — Telehealth: Payer: Self-pay | Admitting: Family Medicine

## 2018-10-10 ENCOUNTER — Other Ambulatory Visit: Payer: Self-pay

## 2018-10-10 ENCOUNTER — Ambulatory Visit (INDEPENDENT_AMBULATORY_CARE_PROVIDER_SITE_OTHER): Payer: Medicare Other | Admitting: Family Medicine

## 2018-10-10 ENCOUNTER — Encounter: Payer: Self-pay | Admitting: Family Medicine

## 2018-10-10 DIAGNOSIS — F4329 Adjustment disorder with other symptoms: Secondary | ICD-10-CM

## 2018-10-10 DIAGNOSIS — R7303 Prediabetes: Secondary | ICD-10-CM

## 2018-10-10 DIAGNOSIS — R030 Elevated blood-pressure reading, without diagnosis of hypertension: Secondary | ICD-10-CM | POA: Diagnosis not present

## 2018-10-10 NOTE — Telephone Encounter (Signed)
LVM for patient to call to schedule 4 month f/u appointment from 10/10/18

## 2019-02-04 DIAGNOSIS — H2513 Age-related nuclear cataract, bilateral: Secondary | ICD-10-CM | POA: Diagnosis not present

## 2019-02-04 DIAGNOSIS — H2512 Age-related nuclear cataract, left eye: Secondary | ICD-10-CM | POA: Diagnosis not present

## 2019-03-05 DIAGNOSIS — H25812 Combined forms of age-related cataract, left eye: Secondary | ICD-10-CM | POA: Diagnosis not present

## 2019-03-05 DIAGNOSIS — H2512 Age-related nuclear cataract, left eye: Secondary | ICD-10-CM | POA: Diagnosis not present

## 2019-03-06 DIAGNOSIS — H2511 Age-related nuclear cataract, right eye: Secondary | ICD-10-CM | POA: Diagnosis not present

## 2019-03-25 NOTE — Progress Notes (Addendum)
[facility] [address], [address] [place], [place] [id number] [phone] [phone]  Date:  03/27/2019   Name:  Courtney Ayala   DOB:  [birth year]   MRN:  [id number]  PCP:  Pearline Cablesopland, Trennon Torbeck C, MD    Chief Complaint: Dyslipidemia (med refills), Flu Vaccine, and Lab Work (a1c, discuss synthroid)   History of Present Illness:  Courtney Ayala is a 78 y.o. very pleasant female patient who presents with the following:  Here today for periodic follow-up visit History of post ablative hypothyroidism, osteoporosis, dyslipidemia, prediabetes  Last seen by myself in April for a virtual visit Her husband has cancer, this is tough for her. He is doing ok, his condition is stable.  He is seeing Dr. Myna HidalgoEnnever today.  She noted her blood pressure going up in April, she thinks due to stress.  She also was not able to exercise at the gym as she likes to do, due to [employer]  Mammogram; she prefers to delay due to pandemic DEXA scan? She also prefers to delay -she will let me know when ready to schedule Flu shot- will do today  Can offer Prevnar 13- she would like this today  Most recent labs about 1 year ago, except A1c and TSH done in March  She had her left cataract removed last month, they will do the right eye in November She has done well from his operation Lab Results  Component Value Date   HGBA1C 6.2 08/16/2018   Lab Results  Component Value Date   TSH 0.36 08/16/2018   Current medications include Lipitor Levothyroxine Omega-3  Patient Active Problem List   Diagnosis Date Noted  . Knee osteoarthritis 06/13/2017  . Osteoporosis 06/12/2017  . Pre-diabetes 08/27/2016  . Postablative hypothyroidism 05/09/2016  . Dyslipidemia 05/09/2016  . Pelvic organ prolapse quantification stage 1 cystocele 03/30/2016    Past Medical History:  Diagnosis Date  . Arthritis    B/L knees  . Hyperlipidemia   . Neuromuscular disorder (HCC)   . Osteoporosis  06/12/2017  . Thyroid disease     Past Surgical History:  Procedure Laterality Date  . ECTOPIC PREGNANCY SURGERY  1972    Social History   Tobacco Use  . Smoking status: Never Smoker  . Smokeless tobacco: Never Used  Substance Use Topics  . Alcohol use: No  . Drug use: No    Family History  Problem Relation Age of Onset  . Hypertension Maternal Grandmother   . Cancer Neg Hx   . Stroke Neg Hx   . Diabetes Neg Hx   . Colon cancer Neg Hx   . Breast cancer Neg Hx     No Known Allergies  Medication list has been reviewed and updated.  Current Outpatient Medications on File Prior to Visit  Medication Sig Dispense Refill  . Calcium Carbonate (CALCIUM 600 PO) Take 600 mg by mouth.    . Ferrous Sulfate (IRON) 325 (65 Fe) MG TABS Take by mouth.    . levothyroxine (SYNTHROID, LEVOTHROID) 88 MCG tablet Take 1 tablet (88 mcg total) by mouth daily before breakfast. 90 tablet 1  . Omega-3 Fatty Acids (FISH OIL) 1000 MG CAPS Take 1,000 mg by mouth.    Marland Kitchen. PROLENSA 0.07 % SOLN STARTING 3 DAYS PRIOR TO SURGERY INSTILL 1 DROP INTO OPERATIVE EYE ONCE DAILY at bedtime AS DIRECTED     No current facility-administered medications on file prior to visit.     Review  of Systems:  As per HPI- otherwise negative. She is generally feeling well, no real concerns.  No chest pain or shortness of breath, no fever or chills   Physical Examination: Vitals:   03/27/19 1041  BP: 128/70  Pulse: (!) 58  Resp: 16  Temp: (!) 96.5 F (35.8 C)  SpO2: 97%   Vitals:   03/27/19 1041  Weight: 142 lb (64.4 kg)  Height: 5' 6.5" (1.689 m)   Body mass index is 22.58 kg/m. Ideal Body Weight: Weight in (lb) to have BMI = 25: 156.9  GEN: WDWN, NAD, Non-toxic, A & O x 3, youthful appearing woman, normal weight HEENT: Atraumatic, Normocephalic. Neck supple. No masses, No LAD.  TM within normal limits bilaterally Ears and Nose: No external deformity. CV: RRR, No M/G/R. No JVD. No thrill. No extra heart  sounds. PULM: CTA B, no wheezes, crackles, rhonchi. No retractions. No resp. distress. No accessory muscle use. ABD: S, NT, ND. No rebound. No HSM. EXTR: No c/c/e NEURO Normal gait.  PSYCH: Normally interactive. Conversant. Not depressed or anxious appearing.  Calm demeanor.    Assessment and Plan: Pre-diabetes - Plan: Hemoglobin A1c  Borderline blood pressure - Plan: CBC, Comprehensive metabolic panel  Postablative hypothyroidism - Plan: TSH  Medication monitoring encounter  Dyslipidemia - Plan: Lipid panel, atorvastatin (LIPITOR) 10 MG tablet  Age-related osteoporosis without current pathological fracture  Immunization due - Plan: Pneumococcal conjugate vaccine 13-valent IM  Here today for a follow-up visit and wellness check Labs ordered as above, will be in touch with her results Blood pressure well controlled Administered flu and Prevnar 13 immunizations Discussed shingles vaccine which is available at pharmacy Order thyroid med once her TSH comes in  Asked her to see me in about 6 months Signed Lamar Blinks, MD  Received her labs 10/14- message to pt  A1c is stable minimally low wbc is baseline for him  Results for orders placed or performed in visit on 03/27/19  CBC  Result Value Ref Range   WBC 3.7 (L) 4.0 - 10.5 K/uL   RBC 4.00 3.87 - 5.11 Mil/uL   Platelets 143.0 (L) 150.0 - 400.0 K/uL   Hemoglobin 12.8 12.0 - 15.0 g/dL   HCT 39.3 36.0 - 46.0 %   MCV 98.2 78.0 - 100.0 fl   MCHC 32.5 30.0 - 36.0 g/dL   RDW 14.3 11.5 - 15.5 %  Comprehensive metabolic panel  Result Value Ref Range   Sodium 142 135 - 145 mEq/L   Potassium 4.2 3.5 - 5.1 mEq/L   Chloride 105 96 - 112 mEq/L   CO2 28 19 - 32 mEq/L   Glucose, Bld 91 70 - 99 mg/dL   BUN 13 6 - 23 mg/dL   Creatinine, Ser 0.82 0.40 - 1.20 mg/dL   Total Bilirubin 0.7 0.2 - 1.2 mg/dL   Alkaline Phosphatase 80 39 - 117 U/L   AST 15 0 - 37 U/L   ALT 10 0 - 35 U/L   Total Protein 7.1 6.0 - 8.3 g/dL   Albumin  4.4 3.5 - 5.2 g/dL   Calcium 9.9 8.4 - 10.5 mg/dL   GFR 81.17 >60.00 mL/min  Hemoglobin A1c  Result Value Ref Range   Hgb A1c MFr Bld 6.2 4.6 - 6.5 %  Lipid panel  Result Value Ref Range   Cholesterol 219 (H) 0 - 200 mg/dL   Triglycerides 49.0 0.0 - 149.0 mg/dL   HDL 90.10 >39.00 mg/dL   VLDL 9.8 0.0 -  40.0 mg/dL   LDL Cholesterol 423 (H) 0 - 99 mg/dL   Total CHOL/HDL Ratio 2    NonHDL 128.94   TSH  Result Value Ref Range   TSH 0.66 0.35 - 4.50 uIU/mL    Thyroid is in range- will refill your current dose of thyroid medication Your blood cell counts look okay.  Your white cell count is minimally low, but this is baseline for you.  I think this is just how you are made but we will continue to monitor periodically Metabolic profile normal A1c is stable in the prediabetes range Cholesterol looks overall very good  Take care, please see me in about 6 months

## 2019-03-25 NOTE — Patient Instructions (Addendum)
It was great to see you again today I will be in touch to do labs ASAP Please consider getting the shingles vaccine, and tetanus booster at your pharmacy  Please let me know when you would like me to set up your mammogram and bone density exam  I will refill your thyroid med as soon as we get your TSH back, in case any adjustment is needed  Please plan to see me in about 6 months

## 2019-03-27 ENCOUNTER — Encounter: Payer: Self-pay | Admitting: Family Medicine

## 2019-03-27 ENCOUNTER — Other Ambulatory Visit: Payer: Self-pay

## 2019-03-27 ENCOUNTER — Ambulatory Visit (INDEPENDENT_AMBULATORY_CARE_PROVIDER_SITE_OTHER): Payer: Medicare Other | Admitting: Family Medicine

## 2019-03-27 VITALS — BP 128/70 | HR 58 | Temp 96.5°F | Resp 16 | Ht 66.5 in | Wt 142.0 lb

## 2019-03-27 DIAGNOSIS — E785 Hyperlipidemia, unspecified: Secondary | ICD-10-CM

## 2019-03-27 DIAGNOSIS — R7303 Prediabetes: Secondary | ICD-10-CM | POA: Diagnosis not present

## 2019-03-27 DIAGNOSIS — M810 Age-related osteoporosis without current pathological fracture: Secondary | ICD-10-CM

## 2019-03-27 DIAGNOSIS — Z5181 Encounter for therapeutic drug level monitoring: Secondary | ICD-10-CM

## 2019-03-27 DIAGNOSIS — R030 Elevated blood-pressure reading, without diagnosis of hypertension: Secondary | ICD-10-CM | POA: Diagnosis not present

## 2019-03-27 DIAGNOSIS — E890 Postprocedural hypothyroidism: Secondary | ICD-10-CM

## 2019-03-27 DIAGNOSIS — Z23 Encounter for immunization: Secondary | ICD-10-CM | POA: Diagnosis not present

## 2019-03-27 LAB — CBC
HCT: 39.3 % (ref 36.0–46.0)
Hemoglobin: 12.8 g/dL (ref 12.0–15.0)
MCHC: 32.5 g/dL (ref 30.0–36.0)
MCV: 98.2 fl (ref 78.0–100.0)
Platelets: 143 10*3/uL — ABNORMAL LOW (ref 150.0–400.0)
RBC: 4 Mil/uL (ref 3.87–5.11)
RDW: 14.3 % (ref 11.5–15.5)
WBC: 3.7 10*3/uL — ABNORMAL LOW (ref 4.0–10.5)

## 2019-03-27 LAB — TSH: TSH: 0.66 u[IU]/mL (ref 0.35–4.50)

## 2019-03-27 LAB — LIPID PANEL
Cholesterol: 219 mg/dL — ABNORMAL HIGH (ref 0–200)
HDL: 90.1 mg/dL (ref >39.00)
LDL Cholesterol: 119 mg/dL — ABNORMAL HIGH (ref 0–99)
NonHDL: 128.94
Total CHOL/HDL Ratio: 2
Triglycerides: 49 mg/dL (ref 0.0–149.0)
VLDL: 9.8 mg/dL (ref 0.0–40.0)

## 2019-03-27 LAB — COMPREHENSIVE METABOLIC PANEL
ALT: 10 U/L (ref 0–35)
AST: 15 U/L (ref 0–37)
Albumin: 4.4 g/dL (ref 3.5–5.2)
Alkaline Phosphatase: 80 U/L (ref 39–117)
BUN: 13 mg/dL (ref 6–23)
CO2: 28 mEq/L (ref 19–32)
Calcium: 9.9 mg/dL (ref 8.4–10.5)
Chloride: 105 mEq/L (ref 96–112)
Creatinine, Ser: 0.82 mg/dL (ref 0.40–1.20)
GFR: 81.17 mL/min (ref >60.00)
Glucose, Bld: 91 mg/dL (ref 70–99)
Potassium: 4.2 mEq/L (ref 3.5–5.1)
Sodium: 142 mEq/L (ref 135–145)
Total Bilirubin: 0.7 mg/dL (ref 0.2–1.2)
Total Protein: 7.1 g/dL (ref 6.0–8.3)

## 2019-03-27 LAB — HEMOGLOBIN A1C: Hgb A1c MFr Bld: 6.2 % (ref 4.6–6.5)

## 2019-03-27 MED ORDER — ATORVASTATIN CALCIUM 10 MG PO TABS: 10.0000 mg | ORAL_TABLET | Freq: Every day | ORAL | 3 refills | Status: DC

## 2019-03-27 MED ORDER — LEVOTHYROXINE SODIUM 88 MCG PO TABS: 88.0000 ug | ORAL_TABLET | Freq: Every day | ORAL | 3 refills | Status: DC

## 2019-03-27 NOTE — Addendum Note (Signed)
Addended by: Lamar Blinks C on: 03/27/2019 07:29 PM   Modules accepted: Orders

## 2019-04-17 DIAGNOSIS — H35352 Cystoid macular degeneration, left eye: Secondary | ICD-10-CM | POA: Diagnosis not present

## 2019-04-29 ENCOUNTER — Encounter (INDEPENDENT_AMBULATORY_CARE_PROVIDER_SITE_OTHER): Payer: Medicare Other | Admitting: Ophthalmology

## 2019-04-29 ENCOUNTER — Other Ambulatory Visit: Payer: Self-pay

## 2019-04-29 DIAGNOSIS — H43813 Vitreous degeneration, bilateral: Secondary | ICD-10-CM

## 2019-04-29 DIAGNOSIS — H353132 Nonexudative age-related macular degeneration, bilateral, intermediate dry stage: Secondary | ICD-10-CM

## 2019-04-29 DIAGNOSIS — H2511 Age-related nuclear cataract, right eye: Secondary | ICD-10-CM | POA: Diagnosis not present

## 2019-04-29 DIAGNOSIS — H59032 Cystoid macular edema following cataract surgery, left eye: Secondary | ICD-10-CM | POA: Diagnosis not present

## 2019-05-27 ENCOUNTER — Other Ambulatory Visit: Payer: Self-pay

## 2019-05-27 ENCOUNTER — Encounter (INDEPENDENT_AMBULATORY_CARE_PROVIDER_SITE_OTHER): Payer: Medicare Other | Admitting: Ophthalmology

## 2019-05-27 DIAGNOSIS — H43813 Vitreous degeneration, bilateral: Secondary | ICD-10-CM | POA: Diagnosis not present

## 2019-05-27 DIAGNOSIS — H2511 Age-related nuclear cataract, right eye: Secondary | ICD-10-CM | POA: Diagnosis not present

## 2019-05-27 DIAGNOSIS — H353131 Nonexudative age-related macular degeneration, bilateral, early dry stage: Secondary | ICD-10-CM

## 2019-05-27 DIAGNOSIS — H59032 Cystoid macular edema following cataract surgery, left eye: Secondary | ICD-10-CM | POA: Diagnosis not present

## 2019-07-08 ENCOUNTER — Encounter (INDEPENDENT_AMBULATORY_CARE_PROVIDER_SITE_OTHER): Payer: Medicare Other | Admitting: Ophthalmology

## 2019-07-08 ENCOUNTER — Other Ambulatory Visit: Payer: Self-pay

## 2019-07-08 DIAGNOSIS — H59032 Cystoid macular edema following cataract surgery, left eye: Secondary | ICD-10-CM | POA: Diagnosis not present

## 2019-07-08 DIAGNOSIS — H43813 Vitreous degeneration, bilateral: Secondary | ICD-10-CM | POA: Diagnosis not present

## 2019-07-08 DIAGNOSIS — H2511 Age-related nuclear cataract, right eye: Secondary | ICD-10-CM | POA: Diagnosis not present

## 2019-07-08 DIAGNOSIS — H353132 Nonexudative age-related macular degeneration, bilateral, intermediate dry stage: Secondary | ICD-10-CM

## 2019-07-08 LAB — HM DIABETES EYE EXAM

## 2019-08-07 ENCOUNTER — Other Ambulatory Visit: Payer: Self-pay

## 2019-08-07 NOTE — Patient Instructions (Addendum)
It was good to see you again today, take care  I would recommend a tetanus vaccine and also the Shingrix series at your pharmacy-at your convenience  We will check labs to for you today and be in touch asap I ordered a bone density and mammogram for you to have done at your convenience

## 2019-08-07 NOTE — Progress Notes (Addendum)
[facility] [address], [address] [place], [place] [address] [id number] [phone]  Date:  08/08/2019   Name:  Courtney Ayala   DOB:  08-Feb-1941   MRN:  [id number]  PCP:  [name], MD    Chief Complaint: Pre-Diabetes (a1c check)   History of Present Illness:  Courtney Ayala is a 79 y.o. very pleasant female patient who presents with the following:  Here today for follow-up visit Last seen by myself in October  History of osteoporosis, prediabetes, dyslipidemia, hypothyroidism, osteoarthritis  Her husband is undergoing cancer treatment- this is going ok, he sees Dr Myna Hidalgo She enjoys going to the gym, this has been put on hold due to COVID-19 Sh is still trying to work out at home as much she is able  She had left cataract surgery last fall that did not go as well as planned- she is now going to see a retinal specialist and is getting kenalog injections to her left eye only If doing well she will be able to get her right eye done soon.  Mammogram- she wishes to defer until after her eyes are done  Bone density due- I will order for her she will schedule with her mammogram Tetanus vaccine due Shingrix  Complete labs done in October, all looked good She is concerned about upcoming possible eye surgery, would like to check her A1c  Lipitor Synthroid Omega-3 Patient Active Problem List   Diagnosis Date Noted  . Knee osteoarthritis 06/13/2017  . Osteoporosis 06/12/2017  . Pre-diabetes 08/27/2016  . Postablative hypothyroidism 05/09/2016  . Dyslipidemia 05/09/2016  . Pelvic organ prolapse quantification stage 1 cystocele 03/30/2016    Past Medical History:  Diagnosis Date  . Arthritis    B/L knees  . Hyperlipidemia   . Neuromuscular disorder (HCC)   . Osteoporosis 06/12/2017  . Thyroid disease     Past Surgical History:  Procedure Laterality Date  . ECTOPIC PREGNANCY SURGERY  1972    Social History   Tobacco  Use  . Smoking status: Never Smoker  . Smokeless tobacco: Never Used  Substance Use Topics  . Alcohol use: No  . Drug use: No    Family History  Problem Relation Age of Onset  . Hypertension Maternal Grandmother   . Cancer Neg Hx   . Stroke Neg Hx   . Diabetes Neg Hx   . Colon cancer Neg Hx   . Breast cancer Neg Hx     No Known Allergies  Medication list has been reviewed and updated.  Current Outpatient Medications on File Prior to Visit  Medication Sig Dispense Refill  . atorvastatin (LIPITOR) 10 MG tablet Take 1 tablet (10 mg total) by mouth daily. 90 tablet 3  . Calcium Carbonate (CALCIUM 600 PO) Take 600 mg by mouth.    . DUREZOL 0.05 % EMUL     . Ferrous Sulfate (IRON) 325 (65 Fe) MG TABS Take by mouth.    . levothyroxine (SYNTHROID) 88 MCG tablet Take 1 tablet (88 mcg total) by mouth daily before breakfast. 90 tablet 3  . Omega-3 Fatty Acids (FISH OIL) 1000 MG CAPS Take 1,000 mg by mouth.    Marland Kitchen PROLENSA 0.07 % SOLN STARTING 3 DAYS PRIOR TO SURGERY INSTILL 1 DROP INTO OPERATIVE EYE ONCE DAILY at bedtime AS DIRECTED     No current facility-administered medications on file prior to visit.    Review of Systems:  As per HPI- otherwise  negative. Pulse Readings from Last 3 Encounters:  08/08/19 (!) 54  03/27/19 (!) 58  08/13/18 (!) 55     Physical Examination: Vitals:   08/08/19 0910 08/08/19 0931  BP: (!) 154/74 138/80  Pulse: (!) 54   Resp: 17   Temp: (!) 96 F (35.6 C)   SpO2: 98%    Vitals:   08/08/19 0910  Weight: 147 lb (66.7 kg)  Height: 5' 6.5" (1.689 m)   Body mass index is 23.37 kg/m. Ideal Body Weight: Weight in (lb) to have BMI = 25: 156.9  GEN: no acute distress.  Normal weight, looks well and younger than age 56: Atraumatic, Normocephalic.  Ears and Nose: No external deformity. CV: RRR, No M/G/R. No JVD. No thrill. No extra heart sounds. PULM: CTA B, no wheezes, crackles, rhonchi. No retractions. No resp. distress. No accessory  muscle use. EXTR: No c/c/e PSYCH: Normally interactive. Conversant.    Assessment and Plan: Pre-diabetes - Plan: Basic metabolic panel, Hemoglobin A1c  Borderline blood pressure  Postablative hypothyroidism - Plan: TSH  Dyslipidemia  Encounter for screening mammogram for malignant neoplasm of breast - Plan: MM 3D SCREEN BREAST BILATERAL  Estrogen deficiency - Plan: DG Bone Density  Ordered mammogram and bone density for her, she will schedule these when she is ready Ordered A1c, BMP, thyroid today.  Patient wonders if she should sign up for a "lifestyle modification program" for diabetes prevention which was suggested by family member.  Advised her that her weight and activity levels are already favorable, in fact she is doing terrific for her age.  If she wishes to join a lifestyle modification program that is okay with me, but she is already doing well  Moderate medical decision making  Will plan further follow- up pending labs.  This visit occurred during the SARS-CoV-2 public health emergency.  Safety protocols were in place, including screening questions prior to the visit, additional usage of staff PPE, and extensive cleaning of exam room while observing appropriate contact time as indicated for disinfecting solutions.    Signed Lamar Blinks, MD  Addendum 2/26, received her labs.  Message to patient  Results for orders placed or performed in visit on 53/97/67  Basic metabolic panel  Result Value Ref Range   Sodium 140 135 - 145 mEq/L   Potassium 3.9 3.5 - 5.1 mEq/L   Chloride 103 96 - 112 mEq/L   CO2 31 19 - 32 mEq/L   Glucose, Bld 101 (H) 70 - 99 mg/dL   BUN 16 6 - 23 mg/dL   Creatinine, Ser 0.96 0.40 - 1.20 mg/dL   GFR 67.61 >60.00 mL/min   Calcium 9.8 8.4 - 10.5 mg/dL  Hemoglobin A1c  Result Value Ref Range   Hgb A1c MFr Bld 6.1 4.6 - 6.5 %  TSH  Result Value Ref Range   TSH 1.08 0.35 - 4.50 uIU/mL

## 2019-08-08 ENCOUNTER — Ambulatory Visit (INDEPENDENT_AMBULATORY_CARE_PROVIDER_SITE_OTHER): Payer: Medicare Other | Admitting: Family Medicine

## 2019-08-08 ENCOUNTER — Encounter: Payer: Self-pay | Admitting: Family Medicine

## 2019-08-08 ENCOUNTER — Other Ambulatory Visit: Payer: Self-pay

## 2019-08-08 ENCOUNTER — Ambulatory Visit: Payer: Medicare Other | Admitting: Family Medicine

## 2019-08-08 VITALS — BP 138/80 | HR 54 | Temp 96.0°F | Resp 17 | Ht 66.5 in | Wt 147.0 lb

## 2019-08-08 DIAGNOSIS — R7303 Prediabetes: Secondary | ICD-10-CM | POA: Diagnosis not present

## 2019-08-08 DIAGNOSIS — E890 Postprocedural hypothyroidism: Secondary | ICD-10-CM | POA: Diagnosis not present

## 2019-08-08 DIAGNOSIS — Z1231 Encounter for screening mammogram for malignant neoplasm of breast: Secondary | ICD-10-CM

## 2019-08-08 DIAGNOSIS — E785 Hyperlipidemia, unspecified: Secondary | ICD-10-CM

## 2019-08-08 DIAGNOSIS — E2839 Other primary ovarian failure: Secondary | ICD-10-CM

## 2019-08-08 DIAGNOSIS — R030 Elevated blood-pressure reading, without diagnosis of hypertension: Secondary | ICD-10-CM | POA: Diagnosis not present

## 2019-08-08 LAB — BASIC METABOLIC PANEL
BUN: 16 mg/dL (ref 6–23)
CO2: 31 mEq/L (ref 19–32)
Calcium: 9.8 mg/dL (ref 8.4–10.5)
Chloride: 103 mEq/L (ref 96–112)
Creatinine, Ser: 0.96 mg/dL (ref 0.40–1.20)
GFR: 67.61 mL/min (ref >60.00)
Glucose, Bld: 101 mg/dL — ABNORMAL HIGH (ref 70–99)
Potassium: 3.9 mEq/L (ref 3.5–5.1)
Sodium: 140 mEq/L (ref 135–145)

## 2019-08-08 LAB — TSH: TSH: 1.08 u[IU]/mL (ref 0.35–4.50)

## 2019-08-09 ENCOUNTER — Encounter: Payer: Self-pay | Admitting: Family Medicine

## 2019-08-09 LAB — HEMOGLOBIN A1C: Hgb A1c MFr Bld: 6.1 % (ref 4.6–6.5)

## 2019-08-15 ENCOUNTER — Ambulatory Visit: Payer: Medicare Other | Attending: Internal Medicine

## 2019-08-15 DIAGNOSIS — Z23 Encounter for immunization: Secondary | ICD-10-CM | POA: Insufficient documentation

## 2019-08-15 NOTE — Progress Notes (Signed)
   Covid-19 Vaccination Clinic  Name:  Emilyn Ruble    MRN: [id number] DOB: December 24, 1940  08/15/2019  Ms. Iacovelli was observed post Covid-19 immunization for 15 minutes without incident. She was provided with Vaccine Information Sheet and instruction to access the V-Safe system.   Ms. Goucher was instructed to call 911 with any severe reactions post vaccine: Marland Kitchen Difficulty breathing  . Swelling of face and throat  . A fast heartbeat  . A bad rash all over body  . Dizziness and weakness   Immunizations Administered    Name Date Dose VIS Date Route   Pfizer COVID-19 Vaccine 08/15/2019 11:39 AM 0.3 mL 05/24/2019 Intramuscular   Manufacturer: [employer], [employer]   Lot: [id number]   NDC: [address]-3

## 2019-08-19 ENCOUNTER — Encounter: Payer: Self-pay | Admitting: Family Medicine

## 2019-08-19 ENCOUNTER — Encounter (INDEPENDENT_AMBULATORY_CARE_PROVIDER_SITE_OTHER): Payer: Medicare Other | Admitting: Ophthalmology

## 2019-08-19 DIAGNOSIS — E11319 Type 2 diabetes mellitus with unspecified diabetic retinopathy without macular edema: Secondary | ICD-10-CM | POA: Diagnosis not present

## 2019-08-19 DIAGNOSIS — E113293 Type 2 diabetes mellitus with mild nonproliferative diabetic retinopathy without macular edema, bilateral: Secondary | ICD-10-CM

## 2019-08-19 DIAGNOSIS — H59032 Cystoid macular edema following cataract surgery, left eye: Secondary | ICD-10-CM

## 2019-08-19 DIAGNOSIS — H2511 Age-related nuclear cataract, right eye: Secondary | ICD-10-CM | POA: Diagnosis not present

## 2019-08-19 DIAGNOSIS — H43813 Vitreous degeneration, bilateral: Secondary | ICD-10-CM | POA: Diagnosis not present

## 2019-08-19 DIAGNOSIS — H353132 Nonexudative age-related macular degeneration, bilateral, intermediate dry stage: Secondary | ICD-10-CM | POA: Diagnosis not present

## 2019-08-19 LAB — HM DIABETES EYE EXAM

## 2019-09-10 ENCOUNTER — Ambulatory Visit: Payer: Medicare Other | Attending: Internal Medicine

## 2019-09-10 DIAGNOSIS — Z23 Encounter for immunization: Secondary | ICD-10-CM

## 2019-09-10 NOTE — Progress Notes (Signed)
   Covid-19 Vaccination Clinic  Name:  Courtney Ayala    MRN: [id number] DOB: 07-11-1940  09/10/2019  [name] was observed post Covid-19 immunization for 15 minutes without incident. She was provided with Vaccine Information Sheet and instruction to access the V-Safe system.   [name] was instructed to call 911 with any severe reactions post vaccine: Marland Kitchen Difficulty breathing  . Swelling of face and throat  . A fast heartbeat  . A bad rash all over body  . Dizziness and weakness   Immunizations Administered    Name Date Dose VIS Date Route   Pfizer COVID-19 Vaccine 09/10/2019  2:13 PM 0.3 mL 05/24/2019 Intramuscular   Manufacturer: [employer], [employer]   Lot: [id number]   NDC: [address]-3

## 2019-09-23 DIAGNOSIS — H35352 Cystoid macular degeneration, left eye: Secondary | ICD-10-CM | POA: Diagnosis not present

## 2019-09-23 DIAGNOSIS — H2511 Age-related nuclear cataract, right eye: Secondary | ICD-10-CM | POA: Diagnosis not present

## 2019-09-23 DIAGNOSIS — E11319 Type 2 diabetes mellitus with unspecified diabetic retinopathy without macular edema: Secondary | ICD-10-CM | POA: Diagnosis not present

## 2019-09-23 DIAGNOSIS — Z961 Presence of intraocular lens: Secondary | ICD-10-CM | POA: Diagnosis not present

## 2019-09-25 ENCOUNTER — Ambulatory Visit: Payer: Medicare Other | Admitting: Family Medicine

## 2019-10-21 ENCOUNTER — Encounter (INDEPENDENT_AMBULATORY_CARE_PROVIDER_SITE_OTHER): Payer: Medicare Other | Admitting: Ophthalmology

## 2019-10-21 ENCOUNTER — Other Ambulatory Visit: Payer: Self-pay

## 2019-10-21 DIAGNOSIS — H353132 Nonexudative age-related macular degeneration, bilateral, intermediate dry stage: Secondary | ICD-10-CM

## 2019-10-21 DIAGNOSIS — H43813 Vitreous degeneration, bilateral: Secondary | ICD-10-CM

## 2019-10-21 DIAGNOSIS — H59032 Cystoid macular edema following cataract surgery, left eye: Secondary | ICD-10-CM | POA: Diagnosis not present

## 2019-10-28 DIAGNOSIS — H2511 Age-related nuclear cataract, right eye: Secondary | ICD-10-CM | POA: Diagnosis not present

## 2019-10-28 DIAGNOSIS — H25041 Posterior subcapsular polar age-related cataract, right eye: Secondary | ICD-10-CM | POA: Diagnosis not present

## 2019-10-28 DIAGNOSIS — H35352 Cystoid macular degeneration, left eye: Secondary | ICD-10-CM | POA: Diagnosis not present

## 2019-10-28 DIAGNOSIS — E11319 Type 2 diabetes mellitus with unspecified diabetic retinopathy without macular edema: Secondary | ICD-10-CM | POA: Diagnosis not present

## 2020-01-21 ENCOUNTER — Encounter (INDEPENDENT_AMBULATORY_CARE_PROVIDER_SITE_OTHER): Payer: Medicare Other | Admitting: Ophthalmology

## 2020-01-21 ENCOUNTER — Other Ambulatory Visit: Payer: Self-pay

## 2020-01-21 DIAGNOSIS — H43813 Vitreous degeneration, bilateral: Secondary | ICD-10-CM

## 2020-01-21 DIAGNOSIS — H353132 Nonexudative age-related macular degeneration, bilateral, intermediate dry stage: Secondary | ICD-10-CM

## 2020-03-02 ENCOUNTER — Ambulatory Visit: Payer: Medicare Other | Admitting: Family Medicine

## 2020-03-03 NOTE — Progress Notes (Addendum)
[facility] [address] [place], [place] [address] [phone]  Date:  03/05/2020   Name:  Courtney Ayala   DOB:  [birth year]   MRN:  [id number]  PCP:  [name], MD    Chief Complaint: Pre-Diabetes (6 month follow up) and Bladder Prolapse (discuss treatment/dx)   History of Present Illness:  Courtney Ayala is a 79 y.o. very pleasant female patient who presents with the following:  Older lady with history of arthritis, osteoporosis, hypothyroidism, dyslipidemia, prediabetes-here today for follow-up visit. Last seen by myself in February of this year; range, A1c 6.1% The pandemic has kept her from being as active as she would like, this is somewhat frustrating She did get a stationary bike for home- she is enjoying this and using it regularly   She does get some tingling in left foot only- she has noticed this for about a year.  No symptoms in her right foot of her hands. Her left foot may be numb when she wakes up in the morning- will wake up as she gets moving It aches sometimes a bit but not generally painful  flu vaccine- she declines today, will do later on  Mammogram due- -order Bone density-ordered COVID-19 series done, can suggest booster- she plans to do a booster at the end of this month  She is getting some dental work done.    She has a history of pelvic organ prolapse for about 4 years. She saw Dr. [name] for this a few years ago, at that time it was mild. It is becoming more problematic, and she is now interested in pursuing other treatment   Patient Active Problem List   Diagnosis Date Noted  . Knee osteoarthritis 06/13/2017  . Osteoporosis 06/12/2017  . Pre-diabetes 08/27/2016  . Postablative hypothyroidism 05/09/2016  . Dyslipidemia 05/09/2016  . Pelvic organ prolapse quantification stage 1 cystocele 03/30/2016    Past Medical History:  Diagnosis Date  . Arthritis    B/L knees  .  Hyperlipidemia   . Neuromuscular disorder (HCC)   . Osteoporosis 06/12/2017  . Thyroid disease     Past Surgical History:  Procedure Laterality Date  . ECTOPIC PREGNANCY SURGERY  1972    Social History   Tobacco Use  . Smoking status: Never Smoker  . Smokeless tobacco: Never Used  Substance Use Topics  . Alcohol use: No  . Drug use: No    Family History  Problem Relation Age of Onset  . Hypertension Maternal Grandmother   . Cancer Neg Hx   . Stroke Neg Hx   . Diabetes Neg Hx   . Colon cancer Neg Hx   . Breast cancer Neg Hx     No Known Allergies  Medication list has been reviewed and updated.  Current Outpatient Medications on File Prior to Visit  Medication Sig Dispense Refill  . atorvastatin (LIPITOR) 10 MG tablet Take 1 tablet (10 mg total) by mouth daily. 90 tablet 3  . Calcium Carbonate (CALCIUM 600 PO) Take 600 mg by mouth.    . Ferrous Sulfate (IRON) 325 (65 Fe) MG TABS Take by mouth.    . levothyroxine (SYNTHROID) 88 MCG tablet Take 1 tablet (88 mcg total) by mouth daily before breakfast. 90 tablet 3  . Omega-3 Fatty Acids (FISH OIL) 1000 MG CAPS Take 1,000 mg by mouth.    Marland Kitchen PROLENSA 0.07 % SOLN STARTING 3 DAYS PRIOR TO SURGERY INSTILL 1  DROP INTO OPERATIVE EYE ONCE DAILY at bedtime AS DIRECTED     No current facility-administered medications on file prior to visit.    Review of Systems:  As per HPI- otherwise negative.   Physical Examination: Vitals:   03/05/20 1424  BP: 126/70  Pulse: (!) 55  Resp: 16  SpO2: 97%   Vitals:   03/05/20 1424  Weight: 142 lb (64.4 kg)  Height: 5' 6.5" (1.689 m)   Body mass index is 22.58 kg/m. Ideal Body Weight: Weight in (lb) to have BMI = 25: 156.9  GEN: no acute distress. Normal weight, looks well and much younger than age HEENT: Atraumatic, Normocephalic.  Ears and Nose: No external deformity. CV: RRR, No M/G/R. No JVD. No thrill. No extra heart sounds. PULM: CTA B, no wheezes, crackles, rhonchi. No  retractions. No resp. distress. No accessory muscle use. ABD: S, NT, ND, +BS. No rebound. No HSM. EXTR: No c/c/e PSYCH: Normally interactive. Conversant.  Strong pulses in both feet Normal monofilament sensation left foot   Assessment and Plan: Other mononeuropathy - Plan: TSH, B12 and Folate Panel  Pre-diabetes - Plan: Hemoglobin A1c  Borderline blood pressure - Plan: CBC, Comprehensive metabolic panel  Postablative hypothyroidism - Plan: TSH  Dyslipidemia - Plan: Lipid panel  Screening mammogram, encounter for - Plan: MM 3D SCREEN BREAST BILATERAL  Estrogen deficiency - Plan: DG Bone Density  Female genital prolapse, unspecified type - Plan: Ambulatory referral to Obstetrics / Gynecology  Numbness of left foot - Plan: B12 and Folate Panel   Patient here today for follow-up visit She is generally doing well, looks wonderful for her age Labs are pending as above Ordered mammogram and bone density Foot numbness-suspect neuropathy. Check B12 and folate. If this is normal, can refer to neurology Referral back to gynecology to discuss options for pelvic prolapse Will plan further follow- up pending labs.  This visit occurred during the SARS-CoV-2 public health emergency.  Safety protocols were in place, including screening questions prior to the visit, additional usage of staff PPE, and extensive cleaning of exam room while observing appropriate contact time as indicated for disinfecting solutions.  Refill thyroid med once TSH comes in   Signed Abbe Amsterdam, MD  Addendum 9/24, received labs as below Message to patient, refill thyroid medication  Results for orders placed or performed in visit on 03/05/20  CBC  Result Value Ref Range   WBC 4.1 3.8 - 10.8 Thousand/uL   RBC 4.03 3.80 - 5.10 Million/uL   Hemoglobin 13.5 11.7 - 15.5 g/dL   HCT 85.4 35 - 45 %   MCV 97.8 80.0 - 100.0 fL   MCH 33.5 (H) 27.0 - 33.0 pg   MCHC 34.3 32.0 - 36.0 g/dL   RDW 62.7 03.5 - 00.9 %    Platelets 150 140 - 400 Thousand/uL   MPV 11.8 7.5 - 12.5 fL  Comprehensive metabolic panel  Result Value Ref Range   Glucose, Bld 93 65 - 99 mg/dL   BUN 20 7 - 25 mg/dL   Creat 3.81 8.29 - 9.37 mg/dL   BUN/Creatinine Ratio NOT APPLICABLE 6 - 22 (calc)   Sodium 141 135 - 146 mmol/L   Potassium 4.3 3.5 - 5.3 mmol/L   Chloride 105 98 - 110 mmol/L   CO2 28 20 - 32 mmol/L   Calcium 9.9 8.6 - 10.4 mg/dL   Total Protein 6.9 6.1 - 8.1 g/dL   Albumin 4.3 3.6 - 5.1 g/dL   Globulin 2.6 1.9 -  3.7 g/dL (calc)   AG Ratio 1.7 1.0 - 2.5 (calc)   Total Bilirubin 0.8 0.2 - 1.2 mg/dL   Alkaline phosphatase (APISO) 77 37 - 153 U/L   AST 16 10 - 35 U/L   ALT 11 6 - 29 U/L  Hemoglobin A1c  Result Value Ref Range   Hgb A1c MFr Bld 5.9 (H) <5.7 % of total Hgb   Mean Plasma Glucose 123 (calc)   eAG (mmol/L) 6.8 (calc)  Lipid panel  Result Value Ref Range   Cholesterol 236 (H) <200 mg/dL   HDL 86 > OR = 50 mg/dL   Triglycerides 47 <629 mg/dL   LDL Cholesterol (Calc) 136 (H) mg/dL (calc)   Total CHOL/HDL Ratio 2.7 <5.0 (calc)   Non-HDL Cholesterol (Calc) 150 (H) <130 mg/dL (calc)  TSH  Result Value Ref Range   TSH 0.50 0.40 - 4.50 mIU/L  B12 and Folate Panel  Result Value Ref Range   Vitamin B-12 197 (L) 200 - 1,100 pg/mL   Folate 20.7 ng/mL

## 2020-03-05 ENCOUNTER — Encounter: Payer: Self-pay | Admitting: Family Medicine

## 2020-03-05 ENCOUNTER — Ambulatory Visit (INDEPENDENT_AMBULATORY_CARE_PROVIDER_SITE_OTHER): Payer: Medicare Other | Admitting: Family Medicine

## 2020-03-05 ENCOUNTER — Other Ambulatory Visit: Payer: Self-pay

## 2020-03-05 VITALS — BP 126/70 | HR 55 | Resp 16 | Ht 66.5 in | Wt 142.0 lb

## 2020-03-05 DIAGNOSIS — E2839 Other primary ovarian failure: Secondary | ICD-10-CM

## 2020-03-05 DIAGNOSIS — G588 Other specified mononeuropathies: Secondary | ICD-10-CM

## 2020-03-05 DIAGNOSIS — N819 Female genital prolapse, unspecified: Secondary | ICD-10-CM | POA: Diagnosis not present

## 2020-03-05 DIAGNOSIS — E890 Postprocedural hypothyroidism: Secondary | ICD-10-CM

## 2020-03-05 DIAGNOSIS — R030 Elevated blood-pressure reading, without diagnosis of hypertension: Secondary | ICD-10-CM | POA: Diagnosis not present

## 2020-03-05 DIAGNOSIS — E785 Hyperlipidemia, unspecified: Secondary | ICD-10-CM | POA: Diagnosis not present

## 2020-03-05 DIAGNOSIS — Z1231 Encounter for screening mammogram for malignant neoplasm of breast: Secondary | ICD-10-CM | POA: Diagnosis not present

## 2020-03-05 DIAGNOSIS — R200 Anesthesia of skin: Secondary | ICD-10-CM

## 2020-03-05 DIAGNOSIS — R7303 Prediabetes: Secondary | ICD-10-CM | POA: Diagnosis not present

## 2020-03-05 DIAGNOSIS — R208 Other disturbances of skin sensation: Secondary | ICD-10-CM

## 2020-03-05 MED ORDER — ATORVASTATIN CALCIUM 10 MG PO TABS: 10.0000 mg | ORAL_TABLET | Freq: Every day | ORAL | 3 refills | Status: DC

## 2020-03-05 NOTE — Patient Instructions (Addendum)
Great to see you again today!  I ordered a mammogram and bone density exam for you  Please get your flu shot this fall, and I would encourage a covid 19 booster  You might also consider getting the shingles vaccine (shingrix) at your pharmacy   I will be in touch with your labs.  Assuming no explanation for your foot numbness I can refer you to neurology at that time if you like  I will refer you back to Dr Burnice LoganKatrinka Blazing to discuss options for your pelvic prolapse   Please see me in 6 months

## 2020-03-06 ENCOUNTER — Encounter: Payer: Self-pay | Admitting: Family Medicine

## 2020-03-06 LAB — CBC
HCT: 39.4 % (ref 35.0–45.0)
Hemoglobin: 13.5 g/dL (ref 11.7–15.5)
MCH: 33.5 pg — ABNORMAL HIGH (ref 27.0–33.0)
MCHC: 34.3 g/dL (ref 32.0–36.0)
MCV: 97.8 fL (ref 80.0–100.0)
MPV: 11.8 fL (ref 7.5–12.5)
Platelets: 150 10*3/uL (ref 140–400)
RBC: 4.03 10*6/uL (ref 3.80–5.10)
RDW: 13 % (ref 11.0–15.0)
WBC: 4.1 10*3/uL (ref 3.8–10.8)

## 2020-03-06 LAB — LIPID PANEL
Cholesterol: 236 mg/dL — ABNORMAL HIGH (ref <200)
HDL: 86 mg/dL (ref >=50)
LDL Cholesterol (Calc): 136 mg/dL (calc) — ABNORMAL HIGH
Non-HDL Cholesterol (Calc): 150 mg/dL (calc) — ABNORMAL HIGH (ref <130)
Total CHOL/HDL Ratio: 2.7 (calc) (ref <5.0)
Triglycerides: 47 mg/dL (ref <150)

## 2020-03-06 LAB — HEMOGLOBIN A1C
Hgb A1c MFr Bld: 5.9 % of total Hgb — ABNORMAL HIGH (ref <5.7)
Mean Plasma Glucose: 123 (calc)
eAG (mmol/L): 6.8 (calc)

## 2020-03-06 LAB — COMPREHENSIVE METABOLIC PANEL
AG Ratio: 1.7 (calc) (ref 1.0–2.5)
ALT: 11 U/L (ref 6–29)
AST: 16 U/L (ref 10–35)
Albumin: 4.3 g/dL (ref 3.6–5.1)
Alkaline phosphatase (APISO): 77 U/L (ref 37–153)
BUN: 20 mg/dL (ref 7–25)
CO2: 28 mmol/L (ref 20–32)
Calcium: 9.9 mg/dL (ref 8.6–10.4)
Chloride: 105 mmol/L (ref 98–110)
Creat: 0.82 mg/dL (ref 0.60–0.88)
Globulin: 2.6 g/dL (calc) (ref 1.9–3.7)
Glucose, Bld: 93 mg/dL (ref 65–99)
Potassium: 4.3 mmol/L (ref 3.5–5.3)
Sodium: 141 mmol/L (ref 135–146)
Total Bilirubin: 0.8 mg/dL (ref 0.2–1.2)
Total Protein: 6.9 g/dL (ref 6.1–8.1)

## 2020-03-06 LAB — B12 AND FOLATE PANEL
Folate: 20.7 ng/mL
Vitamin B-12: 197 pg/mL — ABNORMAL LOW (ref 200–1100)

## 2020-03-06 LAB — TSH: TSH: 0.5 mIU/L (ref 0.40–4.50)

## 2020-03-06 MED ORDER — LEVOTHYROXINE SODIUM 88 MCG PO TABS: 88.0000 ug | ORAL_TABLET | Freq: Every day | ORAL | 3 refills | Status: DC

## 2020-03-06 NOTE — Addendum Note (Signed)
Addended by: Abbe Amsterdam C on: 03/06/2020 06:30 AM   Modules accepted: Orders

## 2020-03-11 DIAGNOSIS — H2511 Age-related nuclear cataract, right eye: Secondary | ICD-10-CM | POA: Diagnosis not present

## 2020-03-11 DIAGNOSIS — H35352 Cystoid macular degeneration, left eye: Secondary | ICD-10-CM | POA: Diagnosis not present

## 2020-03-11 DIAGNOSIS — E11319 Type 2 diabetes mellitus with unspecified diabetic retinopathy without macular edema: Secondary | ICD-10-CM | POA: Diagnosis not present

## 2020-03-11 DIAGNOSIS — Z961 Presence of intraocular lens: Secondary | ICD-10-CM | POA: Diagnosis not present

## 2020-03-24 ENCOUNTER — Ambulatory Visit (HOSPITAL_BASED_OUTPATIENT_CLINIC_OR_DEPARTMENT_OTHER): Payer: Medicare Other

## 2020-03-24 ENCOUNTER — Other Ambulatory Visit (HOSPITAL_BASED_OUTPATIENT_CLINIC_OR_DEPARTMENT_OTHER): Payer: Medicare Other

## 2020-03-28 ENCOUNTER — Ambulatory Visit: Payer: Medicare Other | Attending: Internal Medicine

## 2020-03-28 DIAGNOSIS — Z23 Encounter for immunization: Secondary | ICD-10-CM

## 2020-03-28 NOTE — Progress Notes (Signed)
   Covid-19 Vaccination Clinic  Name:  Courtney Ayala    MRN: [id number] DOB: [birth year]  03/28/2020  [name] was observed post Covid-19 immunization for 15 minutes without incident. She was provided with Vaccine Information Sheet and instruction to access the V-Safe system.   [name] was instructed to call 911 with any severe reactions post vaccine: Marland Kitchen Difficulty breathing  . Swelling of face and throat  . A fast heartbeat  . A bad rash all over body  . Dizziness and weakness

## 2020-04-22 ENCOUNTER — Other Ambulatory Visit: Payer: Self-pay

## 2020-04-22 ENCOUNTER — Ambulatory Visit (INDEPENDENT_AMBULATORY_CARE_PROVIDER_SITE_OTHER): Payer: Medicare Other | Admitting: Obstetrics & Gynecology

## 2020-04-22 ENCOUNTER — Encounter: Payer: Self-pay | Admitting: Obstetrics & Gynecology

## 2020-04-22 VITALS — BP 137/71 | HR 62 | Ht 66.5 in | Wt 145.0 lb

## 2020-04-22 DIAGNOSIS — N8110 Cystocele, unspecified: Secondary | ICD-10-CM

## 2020-04-22 NOTE — Progress Notes (Signed)
History:  79 y.o. G1P0010 here today for f/u of her grade I prolapse. Pt reports that her sx have gotten much worse. She reports that she 'thought her sx would improve over time'. She denies leakage of urine. Pt has never has vaginal deliveries. She had 1 pregnancy that was an ectopic pregnancy. She is not currently sexually active.   The following portions of the patient's history were reviewed and updated as appropriate: allergies, current medications, past family history, past medical history, past social history, past surgical history and problem list.  Review of Systems:  Pertinent items are noted in HPI.    Objective:  Physical Exam Blood pressure 137/71, pulse 62, height 5' 6.5" (1.689 m), weight 145 lb (65.8 kg). CONSTITUTIONAL: Well-developed, well-nourished female in no acute distress.  HENT:  Normocephalic, atraumatic EYES: Conjunctivae and EOM are normal. No scleral icterus.  NECK: Normal range of motion SKIN: Skin is warm and dry. No rash noted. Not diaphoretic.No pallor. NEUROLGIC: Alert and oriented to person, place, and time. Normal coordination.  Abd: Soft, nontender and nondistended Pelvic: Normal appearing external genitalia but the prolapse is noted at grade III at rest.  There is complete procidentia with valsalva. The uterus is small and completely prolapsed. The vaginal mucosa is atrophic. The cervix is atrophic. There is no discharge noted. No palpable masses, no uterine or adnexal tenderness   Assessment & Plan:  Complete procidentia. I have discussed with pt the option of a pessary vs surgical intervention. She would like to consider a pessary but, would also like to hear her options for surgical intervention.    Diagnoses and all orders for this visit:  Pelvic organ prolapse quantification stage 1 cystocele -     Ambulatory referral to Urogynecology  Pt will f/u after her appt with Dr. Florian Buff if she does not opt for surgical management.   Total  face-to-face time with patient was 20 min.  Greater than 50% was spent in counseling and coordination of care with the patient.   Trudie Cervantes L. Harraway-Smith, M.D., [facility]

## 2020-04-28 ENCOUNTER — Encounter: Payer: Self-pay | Admitting: Obstetrics & Gynecology

## 2020-05-25 ENCOUNTER — Ambulatory Visit: Payer: Medicare Other | Admitting: Obstetrics & Gynecology

## 2020-05-29 ENCOUNTER — Other Ambulatory Visit: Payer: Self-pay

## 2020-05-29 ENCOUNTER — Encounter: Payer: Self-pay | Admitting: Family Medicine

## 2020-05-29 ENCOUNTER — Ambulatory Visit (INDEPENDENT_AMBULATORY_CARE_PROVIDER_SITE_OTHER): Payer: Medicare Other | Admitting: Family Medicine

## 2020-05-29 VITALS — BP 140/80 | HR 55 | Temp 98.0°F | Ht 67.0 in | Wt 142.2 lb

## 2020-05-29 DIAGNOSIS — M542 Cervicalgia: Secondary | ICD-10-CM

## 2020-05-29 MED ORDER — MELOXICAM 7.5 MG PO TABS: 7.5000 mg | ORAL_TABLET | Freq: Every day | ORAL | 0 refills | Status: DC

## 2020-05-29 NOTE — Progress Notes (Signed)
Musculoskeletal Exam  Patient: Courtney Ayala DOB: [birth year]  DOS: 05/29/2020  SUBJECTIVE:  Chief Complaint:   Chief Complaint  Patient presents with  . Neck Pain    Courtney Ayala is a 79 y.o.  female for evaluation and treatment of neck pain.   Onset:  1 day ago. No inj or change in activity.  Location:  L side of neck Character:  aching  Progression of issue:  is unchanged Associated symptoms: no hoarseness, redness, bruising, , swelling, ear complaints, decreased ROM Treatment: to date has been heat.   Neurovascular symptoms: no  Past Medical History:  Diagnosis Date  . Arthritis    B/L knees  . Hyperlipidemia   . Neuromuscular disorder (HCC)   . Osteoporosis 06/12/2017  . Thyroid disease     Objective: VITAL SIGNS: BP 140/80 (BP Location: Left Arm, Patient Position: Sitting, Cuff Size: Normal)   Pulse (!) 55   Temp 98 F (36.7 C) (Oral)   Ht 5\' 7"  (1.702 m)   Wt 142 lb 4 oz (64.5 kg)   SpO2 99%   BMI 22.28 kg/m  Constitutional: Well formed, well developed. No acute distress. Eyes: PERRLA, EOMi Musculoskeletal: neck.   Normal active range of motion: yes.   Normal passive range of motion: yes Tenderness to palpation: yes over L SCM Deformity: no Ecchymosis: no Neurologic: Normal sensory function. No focal deficits noted. DTR's equal and symmetric in UE's. No clonus. Ears: Neg b/l Psychiatric: Normal mood. Age appropriate judgment and insight. Alert & oriented x 3.    Assessment:  Neck pain - Plan: meloxicam (MOBIC) 7.5 MG tablet  Plan: I think she strained her SCM. Unlikely to be related to eyes, thyroid, or ears.  Stretches/exercises, heat, ice, Tylenol.  F/u prn. The patient voiced understanding and agreement to the plan.   Ramos, DO 05/29/20  11:41 AM

## 2020-05-29 NOTE — Patient Instructions (Addendum)
Ice/cold pack over area for 10-15 min twice daily.  Heat (pad or rice pillow in microwave) over affected area, 10-15 minutes twice daily.   You strained your sternocleidomastoid (SCM) muscle on the left.   OK to take Tylenol 1000 mg (2 extra strength tabs) or 975 mg (3 regular strength tabs) every 6 hours as needed.  Let us know if you need anything.  EXERCISES RANGE OF MOTION (ROM) AND STRETCHING EXERCISES  These exercises may help you when beginning to rehabilitate your issue. In order to successfully resolve your symptoms, you must improve your posture. These exercises are designed to help reduce the forward-head and rounded-shoulder posture which contributes to this condition. Your symptoms may resolve with or without further involvement from your physician, physical therapist or athletic trainer. While completing these exercises, remember:   Restoring tissue flexibility helps normal motion to return to the joints. This allows healthier, less painful movement and activity.  An effective stretch should be held for at least 20 seconds, although you may need to begin with shorter hold times for comfort.  A stretch should never be painful. You should only feel a gentle lengthening or release in the stretched tissue.  Do not do any stretch or exercise that you cannot tolerate.  STRETCH- Axial Extensors  Lie on your back on the floor. You may bend your knees for comfort. Place a rolled-up hand towel or dish towel, about 2 inches in diameter, under the part of your head that makes contact with the floor.  Gently tuck your chin, as if trying to make a "double chin," until you feel a gentle stretch at the base of your head.  Hold 15-20 seconds. Repeat 2-3 times. Complete this exercise 1 time per day.   STRETCH - Axial Extension   Stand or sit on a firm surface. Assume a good posture: chest up, shoulders drawn back, abdominal muscles slightly tense, knees unlocked (if standing) and feet  hip width apart.  Slowly retract your chin so your head slides back and your chin slightly lowers. Continue to look straight ahead.  You should feel a gentle stretch in the back of your head. Be certain not to feel an aggressive stretch since this can cause headaches later.  Hold for 15-20 seconds. Repeat 2-3 times. Complete this exercise 1 time per day.  STRETCH - Cervical Side Bend   Stand or sit on a firm surface. Assume a good posture: chest up, shoulders drawn back, abdominal muscles slightly tense, knees unlocked (if standing) and feet hip width apart.  Without letting your nose or shoulders move, slowly tip your right / left ear to your shoulder until your feel a gentle stretch in the muscles on the opposite side of your neck.  Hold 15-20 seconds. Repeat 2-3 times. Complete this exercise 1-2 times per day.  STRETCH - Cervical Rotators   Stand or sit on a firm surface. Assume a good posture: chest up, shoulders drawn back, abdominal muscles slightly tense, knees unlocked (if standing) and feet hip width apart.  Keeping your eyes level with the ground, slowly turn your head until you feel a gentle stretch along the back and opposite side of your neck.  Hold 15-20 seconds. Repeat 2-3 times. Complete this exercise 1-2 times per day.  RANGE OF MOTION - Neck Circles   Stand or sit on a firm surface. Assume a good posture: chest up, shoulders drawn back, abdominal muscles slightly tense, knees unlocked (if standing) and feet hip width apart.  Gently roll your head down and around from the back of one shoulder to the back of the other. The motion should never be forced or painful.  Repeat the motion 10-20 times, or until you feel the neck muscles relax and loosen. Repeat 2-3 times. Complete the exercise 1-2 times per day. STRENGTHENING EXERCISES - Cervical Strain and Sprain These exercises may help you when beginning to rehabilitate your injury. They may resolve your symptoms with  or without further involvement from your physician, physical therapist, or athletic trainer. While completing these exercises, remember:   Muscles can gain both the endurance and the strength needed for everyday activities through controlled exercises.  Complete these exercises as instructed by your physician, physical therapist, or athletic trainer. Progress the resistance and repetitions only as guided.  You may experience muscle soreness or fatigue, but the pain or discomfort you are trying to eliminate should never worsen during these exercises. If this pain does worsen, stop and make certain you are following the directions exactly. If the pain is still present after adjustments, discontinue the exercise until you can discuss the trouble with your clinician.  STRENGTH - Cervical Flexors, Isometric  Face a wall, standing about 6 inches away. Place a small pillow, a ball about 6-8 inches in diameter, or a folded towel between your forehead and the wall.  Slightly tuck your chin and gently push your forehead into the soft object. Push only with mild to moderate intensity, building up tension gradually. Keep your jaw and forehead relaxed.  Hold 10 to 20 seconds. Keep your breathing relaxed.  Release the tension slowly. Relax your neck muscles completely before you start the next repetition. Repeat 2-3 times. Complete this exercise 1 time per day.  STRENGTH- Cervical Lateral Flexors, Isometric   Stand about 6 inches away from a wall. Place a small pillow, a ball about 6-8 inches in diameter, or a folded towel between the side of your head and the wall.  Slightly tuck your chin and gently tilt your head into the soft object. Push only with mild to moderate intensity, building up tension gradually. Keep your jaw and forehead relaxed.  Hold 10 to 20 seconds. Keep your breathing relaxed.  Release the tension slowly. Relax your neck muscles completely before you start the next  repetition. Repeat 2-3 times. Complete this exercise 1 time per day.  STRENGTH - Cervical Extensors, Isometric   Stand about 6 inches away from a wall. Place a small pillow, a ball about 6-8 inches in diameter, or a folded towel between the back of your head and the wall.  Slightly tuck your chin and gently tilt your head back into the soft object. Push only with mild to moderate intensity, building up tension gradually. Keep your jaw and forehead relaxed.  Hold 10 to 20 seconds. Keep your breathing relaxed.  Release the tension slowly. Relax your neck muscles completely before you start the next repetition. Repeat 2-3 times. Complete this exercise 1 time per day.  POSTURE AND BODY MECHANICS CONSIDERATIONS Keeping correct posture when sitting, standing or completing your activities will reduce the stress put on different body tissues, allowing injured tissues a chance to heal and limiting painful experiences. The following are general guidelines for improved posture. Your physician or physical therapist will provide you with any instructions specific to your needs. While reading these guidelines, remember:  The exercises prescribed by your provider will help you have the flexibility and strength to maintain correct postures.  The correct posture  provides the optimal environment for your joints to work. All of your joints have less wear and tear when properly supported by a spine with good posture. This means you will experience a healthier, less painful body.  Correct posture must be practiced with all of your activities, especially prolonged sitting and standing. Correct posture is as important when doing repetitive low-stress activities (typing) as it is when doing a single heavy-load activity (lifting).  PROLONGED STANDING WHILE SLIGHTLY LEANING FORWARD When completing a task that requires you to lean forward while standing in one place for a long time, place either foot up on a stationary  2- to 4-inch high object to help maintain the best posture. When both feet are on the ground, the low back tends to lose its slight inward curve. If this curve flattens (or becomes too large), then the back and your other joints will experience too much stress, fatigue more quickly, and can cause pain.   RESTING POSITIONS Consider which positions are most painful for you when choosing a resting position. If you have pain with flexion-based activities (sitting, bending, stooping, squatting), choose a position that allows you to rest in a less flexed posture. You would want to avoid curling into a fetal position on your side. If your pain worsens with extension-based activities (prolonged standing, working overhead), avoid resting in an extended position such as sleeping on your stomach. Most people will find more comfort when they rest with their spine in a more neutral position, neither too rounded nor too arched. Lying on a non-sagging bed on your side with a pillow between your knees, or on your back with a pillow under your knees will often provide some relief. Keep in mind, being in any one position for a prolonged period of time, no matter how correct your posture, can still lead to stiffness.  WALKING Walk with an upright posture. Your ears, shoulders, and hips should all line up. OFFICE WORK When working at a desk, create an environment that supports good, upright posture. Without extra support, muscles fatigue and lead to excessive strain on joints and other tissues.  CHAIR:  A chair should be able to slide under your desk when your back makes contact with the back of the chair. This allows you to work closely.  The chair's height should allow your eyes to be level with the upper part of your monitor and your hands to be slightly lower than your elbows.  Body position: ? Your feet should make contact with the floor. If this is not possible, use a foot rest. ? Keep your ears over your  shoulders. This will reduce stress on your neck and low back.

## 2020-06-08 NOTE — Progress Notes (Deleted)
[name]  Referring Provider: [name]* PCP: [name], MD Date of Service: 06/16/2020  SUBJECTIVE Chief Complaint: No chief complaint on file.  History of Present Illness: Courtney Ayala is a 79 y.o. Black or African-American female seen in consultation at the request of Dr. [name] for evaluation of prolapse.    Review of records from Dr [name] significant for: Prolapse symptoms have been worsening. Complete procedentia noted on exam.   Urinary Symptoms: {urine leakage?:24754} Leaks *** time(s) per {days/wks/mos/yrs:310907}.  Pad use: {NUMBERS 1-10:18281} {pad option:24752} per day.   She {ACTION; IS/IS WUJ:81191478} bothered by her UI symptoms.  Day time voids ***.  Nocturia: *** times per night to void. Voiding dysfunction: she {empties:24755} her bladder well.  {DOES NOT does:27190::"does not"} use a catheter to empty bladder.  When urinating, she feels {urine symptoms:24756} Drinks: *** per day  UTIs: {NUMBERS 1-10:18281} UTI's in the last year.   {ACTIONS;DENIES/REPORTS:21021675::"Denies"} history of {urologic concerns:24757}  Pelvic Organ Prolapse Symptoms:                  She {denies/ admits to:24761} a feeling of a bulge the vaginal area. It has been present for {NUMBER 1-10:22536} {days/wks/mos/yrs:310907}.  She {denies/ admits to:24761} seeing a bulge.  This bulge {ACTION; IS/IS GNF:62130865} bothersome.  Bowel Symptom: Bowel movements: *** time(s) per {Time; day/week/month:13537} Stool consistency: {stool consistency:24758} Straining: {yes/no:19897}.  Splinting: {yes/no:19897}.  Incomplete evacuation: {yes/no:19897}.  She {denies/ admits to:24761} accidental bowel leakage / fecal incontinence  Occurs: *** time(s) per {Time; day/week/month:13537}  Consistency with leakage: {stool consistency:24758} Bowel regimen: {bowel regimen:24759} Last colonoscopy: Date ***, Results  ***  Sexual Function Sexually active: {yes/no:19897}.  Sexual orientation: {Sexual Orientation:[phone]} Pain with sex: {pain with sex:24762}  Pelvic Pain {denies/ admits to:24761} pelvic pain Location: *** Pain occurs: *** Prior pain treatment: *** Improved by: *** Worsened by: ***   Past Medical History:  Past Medical History:  Diagnosis Date  . Arthritis    B/L knees  . Hyperlipidemia   . Neuromuscular disorder (HCC)   . Osteoporosis 06/12/2017  . Thyroid disease      Past Surgical History:   Past Surgical History:  Procedure Laterality Date  . ECTOPIC PREGNANCY SURGERY  1972     Past OB/GYN History: G{NUMBERS 1-10:18281} P{NUMBERS 1-10:18281} Vaginal deliveries: ***,  Forceps/ Vacuum deliveries: ***, Cesarean section: *** Menopausal: {menopausal:24763} Contraception: ***. Last pap smear was ***.  Any history of abnormal pap smears: {yes/no:19897}.   Medications: She has a current medication list which includes the following prescription(s): atorvastatin, brinzolamide, calcium carbonate, iron, levothyroxine, meloxicam, fish oil, and prolensa.   Allergies: Patient has No Known Allergies.   Social History:  Social History   Tobacco Use  . Smoking status: Never Smoker  . Smokeless tobacco: Never Used  Substance Use Topics  . Alcohol use: No  . Drug use: No    Relationship status: {relationship status:24764} She lives with ***.   She {ACTION; IS/IS HQI:69629528} employed ***. Regular exercise: {Yes/No:304960894} History of abuse: {Yes/No:304960894}  Family History:   Family History  Problem Relation Age of Onset  . Hypertension Maternal Grandmother   . Cancer Neg Hx   . Stroke Neg Hx   . Diabetes Neg Hx   . Colon cancer Neg Hx   . Breast cancer Neg Hx      Review of Systems: ROS   OBJECTIVE Physical Exam: There were no vitals filed for this visit.  Physical Exam   GU /  Detailed Urogynecologic Evaluation:  Pelvic Exam: Normal  external female genitalia; Bartholin's and Skene's glands normal in appearance; urethral meatus normal in appearance, no urethral masses or discharge.   CST: {gen negative/positive:315881}  Reflexes: bulbocavernosis {DESC; PRESENT/NOT PRESENT:21021351}, anocutaneous {DESC; PRESENT/NOT PRESENT:21021351} ***bilaterally.  Speculum exam reveals normal vaginal mucosa {With/Without:20273} atrophy. Cervix {exam; gyn cervix:30847}. Uterus {exam; pelvic uterus:30849}. Adnexa {exam; adnexa:12223}.    s/p hysterectomy: Speculum exam reveals normal vaginal mucosa {With/Without:20273}  atrophy and normal vaginal cuff.  Adnexa {exam; adnexa:12223}.    With apex supported, anterior compartment defect was {reduced:24765}  Pelvic floor strength {Roman # I-V:19040}/V, puborectalis {Roman # I-V:19040}/V external anal sphincter {Roman # I-V:19040}/V  Pelvic floor musculature: Right levator {Tender/Non-tender:20250}, Right obturator {Tender/Non-tender:20250}, Left levator {Tender/Non-tender:20250}, Left obturator {Tender/Non-tender:20250}  POP-Q:   POP-Q                                               Aa                                               Ba                                                 C                                                Gh                                               Pb                                               tvl                                                Ap                                               Bp                                                 D     Rectal Exam:  Normal sphincter tone, {rectocele:24766} distal rectocele, enterocoele {DESC; PRESENT/NOT PRESENT:21021351}, no rectal masses, {sign of:24767} dyssynergia when asking the patient to bear down.  Post-Void Residual (PVR) by Bladder  Scan: In order to evaluate bladder emptying, we discussed obtaining a postvoid residual and she agreed to this procedure.  Procedure: The ultrasound unit  was placed on the patient's abdomen in the suprapubic region after the patient had voided. A PVR of *** ml was obtained by bladder scan.  Laboratory Results: @ENCLABS @   ***I visualized the urine specimen, noting the specimen to be {urine color:24768}  ASSESSMENT AND PLAN Ms. Loh is a 79 y.o. with: No diagnosis found.    94, MD   Medical Decision Making:  - Reviewed/ ordered a clinical laboratory test - Reviewed/ ordered a radiologic study - Reviewed/ ordered medicine test - Decision to obtain old records - Discussion of management of or test interpretation with an external physician / other healthcare professional  - Assessment requiring independent historian - Review and summation of prior records - Independent review of image, tracing or specimen

## 2020-06-16 ENCOUNTER — Ambulatory Visit: Payer: Medicare Other | Admitting: Obstetrics and Gynecology

## 2020-07-03 NOTE — Progress Notes (Signed)
[facility] New Patient Evaluation and Consultation  Referring Provider: [name]* PCP: [name], MD Date of Service: 07/07/2020  SUBJECTIVE Chief Complaint: New Patient (Initial Visit) (Dr [name] referral)  History of Present Illness: Courtney Ayala is a 80 y.o. Black or African-American female seen in consultation at the request of Dr. [name] for evaluation of prolapse.    Review of records significant for: Prolapse has been getting worse. Complete procidentia noted on exam with valsalva.   Urinary Symptoms: Does not leak urine.   Day time voids every few hours.  Nocturia: 1 times per night to void. Voiding dysfunction: she empties her bladder well.  does not use a catheter to empty bladder.  When urinating, she feels a weak stream  UTIs: 0 UTI's in the last year.   Denies history of blood in urine and kidney or bladder stones  Pelvic Organ Prolapse Symptoms:                  She Admits to a feeling of a bulge the vaginal area. It has been present for 18 months. Has been getting worse.  She Admits to seeing a bulge.  This bulge is bothersome- more movement during the day.   Bowel Symptom: Bowel movements: every other day  Stool consistency: soft  Straining: no.  Splinting: no.  Incomplete evacuation: no.  She Denies accidental bowel leakage / fecal incontinence Bowel regimen: none  Had cologuard 3 years ago.   Sexual Function Sexually active: no.   Pelvic Pain Admits to pelvic pain, sometimes, due to prolapse  Past Medical History:  Past Medical History:  Diagnosis Date   Arthritis    B/L knees   Hyperlipidemia    Neuromuscular disorder (HCC)    polymyalgia   Osteoporosis 06/12/2017   Thyroid disease      Past Surgical History:   Past Surgical History:  Procedure Laterality Date   cataract surgery     ECTOPIC PREGNANCY SURGERY  1972     Past OB/GYN History: G1 P0- one ectopic Menopausal:  Yes, Denies vaginal bleeding since menopause  Any history of abnormal pap smears: no.   Medications: She has a current medication list which includes the following prescription(s): atorvastatin, brinzolamide, prolensa, calcium carbonate, iron, levothyroxine, meloxicam, and fish oil.   Allergies: Patient has No Known Allergies.   Social History:  Social History   Tobacco Use   Smoking status: Never Smoker   Smokeless tobacco: Never Used  Building services engineer Use: Never used  Substance Use Topics   Alcohol use: No   Drug use: No    Relationship status: married She lives with husband.   She is not employed. Regular exercise: Yes: stationary bike 3x week, walking  Family History:   Family History  Problem Relation Age of Onset   Hypertension Maternal Grandmother    Cancer Neg Hx    Stroke Neg Hx    Diabetes Neg Hx    Colon cancer Neg Hx    Breast cancer Neg Hx      Review of Systems: Review of Systems  Constitutional: Negative for fever, malaise/fatigue and weight loss.  Respiratory: Negative for cough, shortness of breath and wheezing.   Cardiovascular: Positive for chest pain and leg swelling. Negative for palpitations.  Gastrointestinal: Negative for abdominal pain and blood in stool.  Genitourinary: Negative for dysuria.  Musculoskeletal: Negative for myalgias.  Skin: Negative for rash.  Neurological: Positive for dizziness. Negative for headaches.  Endo/Heme/Allergies:  Does not bruise/bleed easily.  Psychiatric/Behavioral: Negative for depression. The patient is not nervous/anxious.      OBJECTIVE Physical Exam: Vitals:   07/07/20 1106  BP: (!) 176/73  Pulse: (!) 55  Weight: 145 lb (65.8 kg)  Height: 5\' 7"  (1.702 m)    Physical Exam Constitutional:      General: She is not in acute distress. Pulmonary:     Effort: Pulmonary effort is normal.  Abdominal:     General: There is no distension.     Palpations: Abdomen is soft.      Tenderness: There is no abdominal tenderness. There is no rebound.  Musculoskeletal:        General: No swelling. Normal range of motion.  Skin:    General: Skin is warm and dry.     Findings: No rash.  Neurological:     Mental Status: She is alert and oriented to person, place, and time.  Psychiatric:        Mood and Affect: Mood normal.        Behavior: Behavior normal.     GU / Detailed Urogynecologic Evaluation:  Pelvic Exam: Normal external female genitalia; Bartholin's and Skene's glands normal in appearance; urethral meatus normal in appearance, no urethral masses or discharge.   CST: negative  Complete procedentia noted. Normal vaginal mucosa with atrophy. Cervix normal appearance. Uterus normal single, nontender. Adnexa no mass, fullness, tenderness.    Pelvic floor strength II/V  Pelvic floor musculature: Right levator non-tender, Right obturator non-tender, Left levator non-tender, Left obturator non-tender  POP-Q:   POP-Q  5                                            Aa   5                                           Ba  6                                              C   4.5                                            Gh  3                                            Pb  6.5                                            tvl   5                                            Ap  5  Bp  0                                              D     Rectal Exam:  Normal external rectum  Post-Void Residual (PVR) by Bladder Scan: In order to evaluate bladder emptying, we discussed obtaining a postvoid residual and she agreed to this procedure.  Procedure: The ultrasound unit was placed on the patient's abdomen in the suprapubic region after the patient had voided. A PVR of 5 ml was obtained by bladder scan.  Laboratory Results: POC urine: + nitrites  I visualized the urine specimen, noting the specimen to be clear  yellow  ASSESSMENT AND PLAN Ms. Truby is a 80 y.o. with:  1. Uterovaginal prolapse, complete   2. Prolapse of anterior vaginal wall   3. Prolapse of posterior vaginal wall   4. Urinary urgency   5. Abnormal urine odor    1. Stage IV anterior, Stage IV posterior, Stage IV apical prolapse For treatment of pelvic organ prolapse, we discussed options for management including expectant management, conservative management, and surgical management, such as Kegels, a pessary, pelvic floor physical therapy, and specific surgical procedures. -She is considering between a pessary and surgery. Discussed surgical management with [facility] Colpocleisis as this would be the least invasive procedure. We reviewed that she would not be able to be sexually active after this procedure- she is not currently and does not desire intercourse in the future.  - If she decides on surgery, she will need simple CMG procedure to r/o occult incontinence  2. Nitrites in urine - will send for urine culture to r/o infection  She will decide on course of action and notify us of her decision. She will follow up with her PCP regarding her elevated BP, as she states it has never been elevated previously.   Marguerita Beards, MD   Medical Decision Making:  - Reviewed/ ordered a clinical laboratory test - Review and summation of prior records

## 2020-07-07 ENCOUNTER — Other Ambulatory Visit: Payer: Self-pay

## 2020-07-07 ENCOUNTER — Encounter: Payer: Self-pay | Admitting: Obstetrics and Gynecology

## 2020-07-07 ENCOUNTER — Ambulatory Visit (INDEPENDENT_AMBULATORY_CARE_PROVIDER_SITE_OTHER): Payer: Medicare Other | Admitting: Obstetrics and Gynecology

## 2020-07-07 VITALS — BP 176/73 | HR 55 | Ht 67.0 in | Wt 145.0 lb

## 2020-07-07 DIAGNOSIS — N813 Complete uterovaginal prolapse: Secondary | ICD-10-CM | POA: Diagnosis not present

## 2020-07-07 DIAGNOSIS — R8290 Unspecified abnormal findings in urine: Secondary | ICD-10-CM

## 2020-07-07 DIAGNOSIS — N8110 Cystocele, unspecified: Secondary | ICD-10-CM

## 2020-07-07 DIAGNOSIS — R3915 Urgency of urination: Secondary | ICD-10-CM | POA: Diagnosis not present

## 2020-07-07 DIAGNOSIS — N816 Rectocele: Secondary | ICD-10-CM | POA: Diagnosis not present

## 2020-07-07 LAB — POCT URINALYSIS DIPSTICK
Appearance: NORMAL
Bilirubin, UA: NEGATIVE
Blood, UA: NEGATIVE
Glucose, UA: NEGATIVE
Ketones, UA: NEGATIVE
Leukocytes, UA: NEGATIVE
Nitrite, UA: POSITIVE
Odor: ABNORMAL
Protein, UA: NEGATIVE
Spec Grav, UA: 1.01 (ref 1.010–1.025)
Urobilinogen, UA: 0.2 E.U./dL
pH, UA: 6.5 (ref 5.0–8.0)

## 2020-07-07 NOTE — Patient Instructions (Signed)
You have a stage 4 (out of 4) prolapse.  We discussed the fact that it is not life threatening but there are several treatment options. For treatment of pelvic organ prolapse, we discussed options for management including expectant management, conservative management, and surgical management, such as Kegels, a pessary, pelvic floor physical therapy, and specific surgical procedures.    

## 2020-07-10 ENCOUNTER — Encounter: Payer: Self-pay | Admitting: Obstetrics and Gynecology

## 2020-07-10 ENCOUNTER — Telehealth: Payer: Self-pay | Admitting: Obstetrics and Gynecology

## 2020-07-10 DIAGNOSIS — N3000 Acute cystitis without hematuria: Secondary | ICD-10-CM

## 2020-07-10 LAB — URINE CULTURE

## 2020-07-10 MED ORDER — SULFAMETHOXAZOLE-TRIMETHOPRIM 800-160 MG PO TABS: 1.0000 | ORAL_TABLET | Freq: Two times a day (BID) | ORAL | 0 refills | Status: AC

## 2020-07-10 NOTE — Telephone Encounter (Signed)
Attempted to call patient regarding positive urine culture, no answer. Left VM stating I was sending antibiotics to the pharmacy and will send more details in a my chart message.   Marguerita Beards, MD

## 2020-07-26 NOTE — Progress Notes (Addendum)
[facility] [address], [address] [place], [place] [address] [phone]  Date:  07/29/2020   Name:  Courtney Ayala   DOB:  1940/07/04   MRN:  [id number]  PCP:  [name], MD    Chief Complaint: lab work (a1c) and Hypertension (Discuss high readings/)   History of Present Illness:  Courtney Ayala is a 80 y.o. very pleasant female patient who presents with the following:  Pt here today for a follow-up visit - history of arthritis, osteoporosis, hypothyroidism, dyslipidemia, prediabetes Last seen by myself in September  Her gynecologist, Dr. [name] referred her to see [facility] urogynecology regarding her bladder prolapse-they are talking about a pessary versus surgery for her At this point Paula is less interested in surgery.  We discussed a pessary-she did not realize this could be removed if she no longer wished to use it.  In this case, she is interested in a pessary option.  She plans to follow-up with urogynecology soon  Flu vaccine- give today  mammo-not done in the last few years.  Jaleena was not sure if she should continue at her age.  We discussed together, she is in excellent health and has a family history of longevity.  I encouraged her to continue routine mammogram screening and she plans to do so Tetanus -should be given at pharmacy  Lab Results  Component Value Date   HGBA1C 5.9 (H) 03/05/2020   BP Readings from Last 3 Encounters:  07/29/20 (!) 158/80  07/07/20 (!) 176/73  05/29/20 140/80    Patient Active Problem List   Diagnosis Date Noted  . Knee osteoarthritis 06/13/2017  . Osteoporosis 06/12/2017  . Pre-diabetes 08/27/2016  . Postablative hypothyroidism 05/09/2016  . Dyslipidemia 05/09/2016  . Pelvic organ prolapse quantification stage 1 cystocele 03/30/2016    Past Medical History:  Diagnosis Date  . Arthritis    B/L knees  . Hyperlipidemia   . Neuromuscular disorder (HCC)     polymyalgia  . Osteoporosis 06/12/2017  . Thyroid disease     Past Surgical History:  Procedure Laterality Date  . cataract surgery    . ECTOPIC PREGNANCY SURGERY  1972    Social History   Tobacco Use  . Smoking status: Never Smoker  . Smokeless tobacco: Never Used  Vaping Use  . Vaping Use: Never used  Substance Use Topics  . Alcohol use: No  . Drug use: No    Family History  Problem Relation Age of Onset  . Hypertension Maternal Grandmother   . Cancer Neg Hx   . Stroke Neg Hx   . Diabetes Neg Hx   . Colon cancer Neg Hx   . Breast cancer Neg Hx     No Known Allergies  Medication list has been reviewed and updated.  Current Outpatient Medications on File Prior to Visit  Medication Sig Dispense Refill  . atorvastatin (LIPITOR) 10 MG tablet Take 1 tablet (10 mg total) by mouth daily. 90 tablet 3  . brinzolamide (AZOPT) 1 % ophthalmic suspension 1 drop in the morning and at bedtime.    . Bromfenac Sodium (PROLENSA) 0.07 % SOLN Apply to eye in the morning and at bedtime.    . Calcium Carbonate (CALCIUM 600 PO) Take 600 mg by mouth.    . Ferrous Sulfate (IRON) 325 (65 Fe) MG TABS Take by mouth.    . levothyroxine (SYNTHROID) 88 MCG tablet Take 1 tablet (88 mcg total) by mouth  daily before breakfast. 90 tablet 3  . meloxicam (MOBIC) 7.5 MG tablet Take 1 tablet (7.5 mg total) by mouth daily. 15 tablet 0  . Omega-3 Fatty Acids (FISH OIL) 1000 MG CAPS Take 1,000 mg by mouth.     No current facility-administered medications on file prior to visit.    Review of Systems:  As per HPI- otherwise negative.    Physical Examination: Vitals:   07/29/20 1504  BP: (!) 158/80  Pulse: (!) 57  Resp: 16  SpO2: 98%   Vitals:   07/29/20 1504  Weight: 144 lb (65.3 kg)  Height: 5\' 7"  (1.702 m)   Body mass index is 22.55 kg/m. Ideal Body Weight: Weight in (lb) to have BMI = 25: 159.3  GEN: no acute distress.  Normal weight, looks terrific for age  HEENT: Atraumatic,  Normocephalic.  Ears and Nose: No external deformity. CV: RRR, No M/G/R. No JVD. No thrill. No extra heart sounds. PULM: CTA B, no wheezes, crackles, rhonchi. No retractions. No resp. distress. No accessory muscle use. ABD: S, NT, ND, +BS. No rebound. No HSM. EXTR: No c/c/e PSYCH: Normally interactive. Conversant.    Assessment and Plan: Needs flu shot - Plan: Flu Vaccine QUAD High Dose(Fluad)  Screening mammogram for breast cancer - Plan: MM 3D SCREEN BREAST BILATERAL  B12 deficiency - Plan: B12  Elevated blood pressure reading - Plan: Basic metabolic panel  Postablative hypothyroidism - Plan: TSH  Pre-diabetes - Plan: Hemoglobin A1c  Female genital prolapse, unspecified type  Flu shot given today Ordered mammogram Discussed prolapse symptoms, she is interested in possible pessary therapy and will discuss with urogynecology Labs are pending as above I suggested starting a low-dose of blood pressure medication today.  The patient would prefer to follow her blood pressure at home for a few weeks to get more information, this is reasonable.  She will contact me if her blood pressure readings are routinely higher than 140/85 Will plan further follow- up pending labs. Routine follow-up in 6 months This visit occurred during the SARS-CoV-2 public health emergency.  Safety protocols were in place, including screening questions prior to the visit, additional usage of staff PPE, and extensive cleaning of exam room while observing appropriate contact time as indicated for disinfecting solutions.    Signed , MD  Received her labs as below, 2/17. Message to patient  Results for orders placed or performed in visit on 07/29/20  Hemoglobin A1c  Result Value Ref Range   Hgb A1c MFr Bld 6.1 4.6 - 6.5 %  B12  Result Value Ref Range   Vitamin B-12 72 (L) 211 - 911 pg/mL  TSH  Result Value Ref Range   TSH 0.90 0.35 - 4.50 uIU/mL  Basic metabolic panel  Result Value Ref  Range   Sodium 140 135 - 145 mEq/L   Potassium 4.0 3.5 - 5.1 mEq/L   Chloride 105 96 - 112 mEq/L   CO2 29 19 - 32 mEq/L   Glucose, Bld 84 70 - 99 mg/dL   BUN 18 6 - 23 mg/dL   Creatinine, Ser 07/31/20 0.40 - 1.20 mg/dL   GFR 4.70 (L) 96.28 mL/min   Calcium 9.9 8.4 - 10.5 mg/dL

## 2020-07-29 ENCOUNTER — Encounter: Payer: Self-pay | Admitting: Family Medicine

## 2020-07-29 ENCOUNTER — Other Ambulatory Visit: Payer: Self-pay

## 2020-07-29 ENCOUNTER — Ambulatory Visit (INDEPENDENT_AMBULATORY_CARE_PROVIDER_SITE_OTHER): Payer: Medicare Other | Admitting: Family Medicine

## 2020-07-29 VITALS — BP 158/80 | HR 57 | Resp 16 | Ht 67.0 in | Wt 144.0 lb

## 2020-07-29 DIAGNOSIS — E538 Deficiency of other specified B group vitamins: Secondary | ICD-10-CM

## 2020-07-29 DIAGNOSIS — R030 Elevated blood-pressure reading, without diagnosis of hypertension: Secondary | ICD-10-CM

## 2020-07-29 DIAGNOSIS — Z23 Encounter for immunization: Secondary | ICD-10-CM | POA: Diagnosis not present

## 2020-07-29 DIAGNOSIS — E890 Postprocedural hypothyroidism: Secondary | ICD-10-CM | POA: Diagnosis not present

## 2020-07-29 DIAGNOSIS — N819 Female genital prolapse, unspecified: Secondary | ICD-10-CM

## 2020-07-29 DIAGNOSIS — R7303 Prediabetes: Secondary | ICD-10-CM | POA: Diagnosis not present

## 2020-07-29 DIAGNOSIS — Z1231 Encounter for screening mammogram for malignant neoplasm of breast: Secondary | ICD-10-CM

## 2020-07-29 NOTE — Patient Instructions (Signed)
Great to see you again today as always!  You might consider going with the pessary for your bladder issues.  You can take this out / stop using it at any point per my understanding  I will be in touch with your labs Mammogram ordered at the [facility] in [facility]  [address] [place],  [place]  [address] Main: [phone]  Please keep an eye on your blood pressure at home- if you are consistently running higher than 140/85 we should consider a low dose of blood pressure medication for you  Please see me in 6 months

## 2020-07-30 ENCOUNTER — Encounter: Payer: Self-pay | Admitting: Family Medicine

## 2020-07-30 LAB — BASIC METABOLIC PANEL
BUN: 18 mg/dL (ref 6–23)
CO2: 29 mEq/L (ref 19–32)
Calcium: 9.9 mg/dL (ref 8.4–10.5)
Chloride: 105 mEq/L (ref 96–112)
Creatinine, Ser: 0.94 mg/dL (ref 0.40–1.20)
GFR: 56.99 mL/min — ABNORMAL LOW (ref >60.00)
Glucose, Bld: 84 mg/dL (ref 70–99)
Potassium: 4 mEq/L (ref 3.5–5.1)
Sodium: 140 mEq/L (ref 135–145)

## 2020-07-30 LAB — VITAMIN B12: Vitamin B-12: 72 pg/mL — ABNORMAL LOW (ref 211–911)

## 2020-07-30 LAB — TSH: TSH: 0.9 u[IU]/mL (ref 0.35–4.50)

## 2020-07-30 LAB — HEMOGLOBIN A1C: Hgb A1c MFr Bld: 6.1 % (ref 4.6–6.5)

## 2020-08-10 NOTE — Progress Notes (Signed)
[facility] Return Visit  SUBJECTIVE  History of Present Illness: Manpreet Kemmer is a 80 y.o. female seen in follow-up for prolapse. Last visit she was diagnosed with a urinary tract infection.  She is not sure if the infection has gone away and would like to check. She denies dysuria but has some frequency which has improved with decreasing soda intake. Has been drinking more water overall.   She is not interested in a pessary or surgery for prolapse at this time but is potentially interested in physical therapy.    Past Medical History: Patient  has a past medical history of Arthritis, Hyperlipidemia, Neuromuscular disorder (HCC), Osteoporosis (06/12/2017), and Thyroid disease.   Past Surgical History: She  has a past surgical history that includes Ectopic pregnancy surgery (1972) and cataract surgery.   Medications: She has a current medication list which includes the following prescription(s): atorvastatin, brinzolamide, prolensa, calcium carbonate, [START ON 08/13/2020] estradiol, iron, levothyroxine, meloxicam, and fish oil.   Allergies: Patient has No Known Allergies.   Social History: Patient  reports that she has never smoked. She has never used smokeless tobacco. She reports that she does not drink alcohol and does not use drugs.      OBJECTIVE     Physical Exam: Vitals:   08/11/20 0919  Weight: 144 lb (65.3 kg)  Height: 5\' 7"  (1.702 m)   Gen: No apparent distress, A&O x 3.  Detailed Urogynecologic Evaluation:  Deferred. Prior exam showed:  POP-Q:   POP-Q  5                                            Aa   5                                           Ba  6                                              C   4.5                                            Gh  3                                            Pb  6.5                                            tvl   5                                            Ap  5  Bp  0                                              D      POC urine: + leukocytes and nitrites  ASSESSMENT AND PLAN    Ms. Ruminski is a 80 y.o. with:  1. Urinary urgency   2. Acute cystitis without hematuria   3. Uterovaginal prolapse, incomplete   4. Uterovaginal prolapse, complete    1. UTI - She may have another infection, will send the urine for culture and treat if necessary. In general, we discussed that we do not need to test unless she has symptoms in the future.  - For treatment of recurrent urinary tract infections, we discussed management of recurrent UTIs including prophylaxis with transvaginal estrogen therapy, and cranberry supplements.  She will get cranberry tablets over the counter. Prescribed estrace cream 0.5g nightly for two weeks then twice a week after  2. Prolapse - Discussed that while pelvic floor PT will be useful for strengthening the pelvic floor muscles and other pelvic floor symptoms, it will likely not reverse her prolapse at her stage. She understands this. Referral placed.  - Also, since she is not interested in a pessary but the prolapse still bothers her when she is active/ exercising, recommended she buy prolapse support underwear, which can help hold her prolapse inside throughout the day.   Return 2 months for follow up.   Marguerita Beards, MD  Time spent: I spent 30 minutes dedicated to the care of this patient on the date of this encounter to include pre-visit review of records, face-to-face time with the patient and post visit documentation and ordering medication/ testing.

## 2020-08-11 ENCOUNTER — Other Ambulatory Visit: Payer: Self-pay

## 2020-08-11 ENCOUNTER — Ambulatory Visit (INDEPENDENT_AMBULATORY_CARE_PROVIDER_SITE_OTHER): Payer: Medicare Other | Admitting: Obstetrics and Gynecology

## 2020-08-11 ENCOUNTER — Encounter: Payer: Self-pay | Admitting: Obstetrics and Gynecology

## 2020-08-11 VITALS — BP 156/78 | HR 79 | Ht 67.0 in | Wt 144.0 lb

## 2020-08-11 DIAGNOSIS — N3000 Acute cystitis without hematuria: Secondary | ICD-10-CM

## 2020-08-11 DIAGNOSIS — R3915 Urgency of urination: Secondary | ICD-10-CM

## 2020-08-11 DIAGNOSIS — N813 Complete uterovaginal prolapse: Secondary | ICD-10-CM | POA: Diagnosis not present

## 2020-08-11 DIAGNOSIS — N812 Incomplete uterovaginal prolapse: Secondary | ICD-10-CM

## 2020-08-11 LAB — POCT URINALYSIS DIPSTICK
Appearance: NORMAL
Bilirubin, UA: NEGATIVE
Blood, UA: NEGATIVE
Glucose, UA: NEGATIVE
Ketones, UA: NEGATIVE
Nitrite, UA: POSITIVE
Protein, UA: NEGATIVE
Spec Grav, UA: 1.01 (ref 1.010–1.025)
Urobilinogen, UA: 0.2 E.U./dL
pH, UA: 5 (ref 5.0–8.0)

## 2020-08-11 MED ORDER — ESTRADIOL 0.1 MG/GM VA CREA: 0.5000 g | TOPICAL_CREAM | VAGINAL | 11 refills | Status: DC

## 2020-08-11 NOTE — Patient Instructions (Addendum)
For prevention of urinary tract infections, make sure you are drinking water. You can also take cranberry pills (over the counter) and use vaginal estrogen cream.   Start vaginal estrogen therapy nightly for two weeks then 2 times weekly at night for treatment of vaginal atrophy (dryness of the vaginal tissues).  Please let us know if the prescription is too expensive and we can look for alternative options.   For prolapse, I have placed an order for physical therapy. Also, you can use pelvic support underwear during exercise or throughout the day- can buy online.   We will send your urine for culture.

## 2020-08-14 LAB — URINE CULTURE

## 2020-08-17 ENCOUNTER — Telehealth: Payer: Self-pay | Admitting: Obstetrics and Gynecology

## 2020-08-17 DIAGNOSIS — N3000 Acute cystitis without hematuria: Secondary | ICD-10-CM

## 2020-08-17 MED ORDER — SULFAMETHOXAZOLE-TRIMETHOPRIM 800-160 MG PO TABS: 1.0000 | ORAL_TABLET | Freq: Two times a day (BID) | ORAL | 0 refills | Status: AC

## 2020-08-17 NOTE — Telephone Encounter (Signed)
Please contact patient and let her know she has a urinary tract infection based on her urine culture results. I have sent bactrim to her pharmacy ([facility] on [address])- she should take twice a day for 3 days.

## 2020-08-17 NOTE — Telephone Encounter (Signed)
LMOM for pt to return my call.

## 2020-08-18 ENCOUNTER — Telehealth: Payer: Self-pay | Admitting: *Deleted

## 2020-08-18 NOTE — Telephone Encounter (Signed)
DOB verified. Informed pt that her urine culture result shows a UTI. Advised that a prescription for Bactrim had been sent to her pharmacy. Advised to start taking it today, take twice a day, and for 3 days. Pt verbalized understanding.

## 2020-09-23 ENCOUNTER — Ambulatory Visit: Payer: Medicare Other

## 2020-10-14 ENCOUNTER — Encounter: Payer: Self-pay | Admitting: Obstetrics and Gynecology

## 2020-10-14 ENCOUNTER — Ambulatory Visit (INDEPENDENT_AMBULATORY_CARE_PROVIDER_SITE_OTHER): Payer: Medicare Other | Admitting: Obstetrics and Gynecology

## 2020-10-14 ENCOUNTER — Other Ambulatory Visit: Payer: Self-pay

## 2020-10-14 VITALS — BP 175/73 | HR 55 | Ht 67.0 in | Wt 144.0 lb

## 2020-10-14 DIAGNOSIS — N812 Incomplete uterovaginal prolapse: Secondary | ICD-10-CM

## 2020-10-14 DIAGNOSIS — N390 Urinary tract infection, site not specified: Secondary | ICD-10-CM

## 2020-10-14 NOTE — Patient Instructions (Signed)
Continue with the estrogen cream twice a week and cranberry tablets daily to prevent urinary tract infections.

## 2020-10-14 NOTE — Progress Notes (Signed)
[facility] Return Visit  SUBJECTIVE  History of Present Illness: Courtney Ayala is a 80 y.o. female seen in follow-up for recurrent UTI and prolapse. Plan at last visit was to start estrace cream and cranberry supplements. She was diagnosed with a UTI and treated with bactrim. Referral was also placed to pelvic PT (first appt tomorrow) and had recommended pelvic support underwear for her prolapse. She did not feel like she needed it.   Missed estrace cream for a week. Has not had any urinary tract symptoms, has had occasional irritation. Has been also been taking the cranberry tablets.   She will not be able to make the appt for the physical therapy due to the family death.   Past Medical History: Patient  has a past medical history of Arthritis, Hyperlipidemia, Neuromuscular disorder (HCC), Osteoporosis (06/12/2017), and Thyroid disease.   Past Surgical History: She  has a past surgical history that includes Ectopic pregnancy surgery (1972) and cataract surgery.   Medications: She has a current medication list which includes the following prescription(s): atorvastatin, brinzolamide, prolensa, calcium carbonate, estradiol, iron, levothyroxine, meloxicam, fish oil, and vitamin b-12.   Allergies: Patient has No Known Allergies.   Social History: Patient  reports that she has never smoked. She has never used smokeless tobacco. She reports that she does not drink alcohol and does not use drugs.      OBJECTIVE     Physical Exam: Vitals:   10/14/20 0911  BP: (!) 175/73  Pulse: (!) 55  Weight: 144 lb (65.3 kg)  Height: 5\' 7"  (1.702 m)   Gen: No apparent distress, A&O x 3.  Detailed Urogynecologic Evaluation:  Deferred. Prior exam showed:  No flowsheet data found.     ASSESSMENT AND PLAN    Courtney Ayala is a 80 y.o. with:  1. Recurrent UTI   2. Uterovaginal prolapse, incomplete     1. UTI - continue with estrace cream twice weekly and cranberry tablets for  prevention of UTI. No current symptoms.   2. POP - she will reschedule her PT session - Does not desire any further intervention at this time.   Return as needed if symptoms worsen.   94, MD  Time spent: I spent 20 minutes dedicated to the care of this patient on the date of this encounter to include pre-visit review of records, face-to-face time with the patient and post visit documentation.

## 2020-10-15 ENCOUNTER — Ambulatory Visit: Payer: Medicare Other | Admitting: Physical Therapy

## 2020-10-26 ENCOUNTER — Other Ambulatory Visit (HOSPITAL_BASED_OUTPATIENT_CLINIC_OR_DEPARTMENT_OTHER): Payer: Self-pay

## 2020-10-26 ENCOUNTER — Other Ambulatory Visit: Payer: Self-pay

## 2020-10-26 ENCOUNTER — Ambulatory Visit: Payer: Medicare Other | Attending: Internal Medicine

## 2020-10-26 DIAGNOSIS — Z23 Encounter for immunization: Secondary | ICD-10-CM

## 2020-10-26 MED ORDER — PFIZER-BIONT COVID-19 VAC-TRIS 30 MCG/0.3ML IM SUSP
INTRAMUSCULAR | 0 refills | Status: DC
  Filled 2020-10-26: qty 0.3, 1d supply, fill #0

## 2020-10-26 NOTE — Progress Notes (Signed)
   Covid-19 Vaccination Clinic  Name:  Marka Treloar    MRN: [id number] DOB: 06-24-1940  10/26/2020  Ms. Bottger was observed post Covid-19 immunization for 15 minutes without incident. She was provided with Vaccine Information Sheet and instruction to access the V-Safe system.   Ms. Weigand was instructed to call 911 with any severe reactions post vaccine: Marland Kitchen Difficulty breathing  . Swelling of face and throat  . A fast heartbeat  . A bad rash all over body  . Dizziness and weakness   Immunizations Administered    Name Date Dose VIS Date Route   PFIZER Comrnaty(Gray TOP) Covid-19 Vaccine 10/26/2020 10:52 AM 0.3 mL 05/21/2020 Intramuscular   Manufacturer: [employer], [employer]   Lot: [id number]   NDC: [phone]

## 2020-12-08 ENCOUNTER — Ambulatory Visit: Payer: Medicare Other | Admitting: Physical Therapy

## 2020-12-21 ENCOUNTER — Ambulatory Visit: Payer: Medicare Other | Attending: Obstetrics and Gynecology | Admitting: Physical Therapy

## 2020-12-21 ENCOUNTER — Encounter: Payer: Self-pay | Admitting: Physical Therapy

## 2020-12-21 ENCOUNTER — Other Ambulatory Visit: Payer: Self-pay

## 2020-12-21 DIAGNOSIS — M6281 Muscle weakness (generalized): Secondary | ICD-10-CM | POA: Diagnosis present

## 2020-12-21 DIAGNOSIS — R279 Unspecified lack of coordination: Secondary | ICD-10-CM | POA: Diagnosis not present

## 2020-12-21 NOTE — Therapy (Signed)
[facility] [address], [address] [place], [place], [address] Phone: [phone]   Fax:  [phone]  Physical Therapy Evaluation  Patient Details  Name: Courtney Ayala MRN: [id number] Date of Birth: 1941-05-17 Referring Provider (PT): Marguerita Beards, MD   Encounter Date: 12/21/2020   PT End of Session - 12/21/20 1001     Visit Number 1    Date for PT Re-Evaluation 03/15/21    Authorization Type meicare A/B    PT Start Time 1002    PT Stop Time 1040    PT Time Calculation (min) 38 min    Activity Tolerance Patient tolerated treatment well    Behavior During Therapy [facility] for tasks assessed/performed             Past Medical History:  Diagnosis Date   Arthritis    B/L knees   Hyperlipidemia    Neuromuscular disorder (HCC)    polymyalgia   Osteoporosis 06/12/2017   Thyroid disease     Past Surgical History:  Procedure Laterality Date   cataract surgery     ECTOPIC PREGNANCY SURGERY  1972    There were no vitals filed for this visit.    Subjective Assessment - 12/21/20 1003     Subjective Pt is here due to prolapse and feels it on the Left lower abdomen when lying down in bed at night.   Pt states she was using asper cream and that might help some. The pelvic is there but it doesn't bother me unless I am urinating and I have to press on the prolapse and the urine comes out.    Patient Stated Goals figre out the pain in the abdomen    Currently in Pain? Yes    Pain Score 3     Pain Location Abdomen    Pain Orientation Left;Lower    Pain Descriptors / Indicators Aching    Pain Type Chronic pain    Pain Radiating Towards around the side of the Lt hip    Pain Onset More than a month ago   3 years ago   Pain Frequency Intermittent    Aggravating Factors  lying down at night    Pain Relieving Factors get up and move    Effect of Pain on Daily Activities no    Multiple Pain Sites No                 [facility] PT Assessment - 12/21/20 0001       Assessment   Medical Diagnosis N81.3 (ICD-10-CM) - Uterovaginal prolapse, complete    Referring Provider (PT) Marguerita Beards, MD    Prior Therapy No      Precautions   Precautions None      Balance Screen   Has the patient fallen in the past 6 months No      Home Environment   Living Environment Private residence    Living Arrangements Spouse/significant other      Prior Function   Level of Independence Independent    Leisure exercise and travel      Cognition   Overall Cognitive Status Within Functional Limits for tasks assessed      Functional Tests   Functional tests Single leg stance      Single Leg Stance   Comments trendelenburg Lt, instability bil 5 sec hold      Posture/Postural Control   Posture/Postural Control Postural limitations    Postural Limitations Weight shift left;Rounded Shoulders;Increased thoracic  kyphosis   scoliosis to the Lt     ROM / Strength   AROM / PROM / Strength PROM;Strength      PROM   Overall PROM Comments hip ER/IR 50%      Strength   Overall Strength Comments hip adduction 4/5: Lt hip abduction 4+/5      Flexibility   Soft Tissue Assessment /Muscle Length yes    Hamstrings WNL      Special Tests   Other special tests FABER, FADIR, scour - Lt hip all negative      Ambulation/Gait   Gait Pattern Trendelenburg                        Objective measurements completed on examination: See above findings.     Pelvic Floor Special Questions - 12/21/20 0001     Prior Pelvic/Prostate Exam Yes    Prior Pregnancies Yes    Number of Pregnancies 1   1 ectopic   Currently Sexually Active No   very occasionally   Urinary Leakage No    Urinary urgency Yes    Urinary frequency nocturia sometimes    Fecal incontinence No    Falling out feeling (prolapse) Yes    Activities that cause feeling of prolapse unsure    Skin Integrity Intact    Prolapse Uterine     Pelvic Floor Internal Exam Pt identity confimred and internal sof ttissue assessed with consent    Exam Type Vaginal    Sensation normal    Palpation transverse peroneus tight and elevated tone; levators tight bil Lt>Rt    Strength fair squeeze, definite lift    Strength # of reps 2   10/10 sec quick flicks   Strength # of seconds 3                      PT Education - 12/21/20 1105     Education Details Access Code: [email or web address]    Person(s) Educated Patient    Methods Explanation;Demonstration;Verbal cues;Handout;Tactile cues    Comprehension Verbalized understanding;Returned demonstration              PT Short Term Goals - 12/21/20 1013       PT SHORT TERM GOAL #1   Title ind with initil HEP    Time 4    Period Weeks    Status New    Target Date 01/18/21               PT Long Term Goals - 12/21/20 1014       PT LONG TERM GOAL #1   Title Pt will be ind with advanced HEP    Time 12    Period Weeks    Status New    Target Date 03/15/21      PT LONG TERM GOAL #2   Title Pt will demonstrates single leg stand for 10 sec bil without trendelenburg    Time 12    Period Weeks    Status New    Target Date 03/15/21      PT LONG TERM GOAL #3   Title Pt will report at least 50% less pelvic pain    Time 12    Period Weeks    Status New    Target Date 03/15/21                    Plan - 12/21/20 1055     Clinical  Impression Statement Pt presents to skilled PT due to uterine prolapse.  Pt has clinically significant findins such as Trendelenburg on the Lt side and decreased ROM of hip rotation bilaterally .Pt has some weakness of bil hip and instability with SLS bil Lt worse than Rt. She has posture abnormalities as mentioned above.  Pt demonstrates high tone of transverse peroneus muscle and difficulty relaxing after contacting the muscles. She can do kegel correctly with 3/5 MMT strength but she is unaware of when she is losing the contraction  which happens after about 3 seconds. Pt has good quick flick rate of 10/10 seconds.  Pt will benefit from skilled PT to address these symptoms and work on improved quality of life.    Examination-Activity Limitations Toileting    Stability/Clinical Decision Making Evolving/Moderate complexity    Clinical Decision Making Low    PT Frequency 1x / week    PT Duration 12 weeks    PT Treatment/Interventions ADLs/Self Care Home Management;Biofeedback;Cryotherapy;Electrical Stimulation;Neuromuscular re-education;Therapeutic exercise;Therapeutic activities;Moist Heat;Patient/family education;Manual techniques;Dry needling;Taping;Passive range of motion    PT Next Visit Plan reposition polapse, hips on wedge, kegel ex's in supine, lumbar and thoracic stretches, hip rotation stretch    PT Home Exercise Plan Access Code: [email or web address]  URL: [email or web address]  Date: 12/21/2020  Prepared by: Dwana Curd    Exercises  Seated Correct Posture - 1 x daily - 7 x weekly - 3 sets - 10 reps    Consulted and Agree with Plan of Care Patient             Patient will benefit from skilled therapeutic intervention in order to improve the following deficits and impairments:  Pain, Decreased coordination, Decreased range of motion, Impaired tone, Decreased strength, Postural dysfunction  Visit Diagnosis: Unspecified lack of coordination  Muscle weakness (generalized)     Problem List Patient Active Problem List   Diagnosis Date Noted   Knee osteoarthritis 06/13/2017   Osteoporosis 06/12/2017   Pre-diabetes 08/27/2016   Postablative hypothyroidism 05/09/2016   Dyslipidemia 05/09/2016   Pelvic organ prolapse quantification stage 1 cystocele 03/30/2016    Brayton Caves Nakiyah Beverley, PT 12/21/2020, 11:17 AM  [facility] [address] [place], [place], [address] Phone: [phone]   Fax:  [phone]  Name: Vienne Corcoran MRN:  [id number] Date of Birth: 09-11-1940

## 2020-12-21 NOTE — Patient Instructions (Signed)
Access Code: [email or web address] URL: [email or web address] Date: 12/21/2020 Prepared by: Dwana Curd  Exercises Seated Correct Posture - 1 x daily - 7 x weekly - 3 sets - 10 reps

## 2020-12-29 ENCOUNTER — Ambulatory Visit: Payer: Medicare Other | Admitting: Physical Therapy

## 2020-12-29 ENCOUNTER — Other Ambulatory Visit: Payer: Self-pay

## 2020-12-29 ENCOUNTER — Encounter: Payer: Medicare Other | Admitting: Physical Therapy

## 2020-12-29 ENCOUNTER — Encounter: Payer: Self-pay | Admitting: Physical Therapy

## 2020-12-29 DIAGNOSIS — R279 Unspecified lack of coordination: Secondary | ICD-10-CM | POA: Diagnosis not present

## 2020-12-29 DIAGNOSIS — M6281 Muscle weakness (generalized): Secondary | ICD-10-CM

## 2020-12-29 NOTE — Patient Instructions (Signed)
Access Code: [email or web address] URL: [email or web address] Date: 12/29/2020 Prepared by: Dwana Curd  Exercises Seated Correct Posture - 1 x daily - 7 x weekly - 3 sets - 10 reps Supine Lower Trunk Rotation - 1 x daily - 7 x weekly - 10 reps - 1 sets - 5 sec hold Supine Figure 4 Piriformis Stretch - 1 x daily - 7 x weekly - 3 reps - 1 sets - 30 sec hold Seated Sidebending Arms Overhead - 1 x daily - 7 x weekly - 3 sets - 10 reps Sidelying Thoracic Rotation with Open Book - 1 x daily - 7 x weekly - 5 reps - 1 sets - 10 sec hold Supine Diaphragmatic Breathing with Pelvic Floor Lengthening - 3 x daily - 7 x weekly - 10 reps - 1 sets Supine Hamstring Stretch with Strap - 1 x daily - 7 x weekly - 3 reps - 1 sets - 30 sec hold

## 2020-12-29 NOTE — Therapy (Signed)
[facility] [address], [address] [place], [place], [address] Phone: [phone]   Fax:  [phone]  Physical Therapy Treatment  Patient Details  Name: Courtney Ayala MRN: [id number] Date of Birth: 04/07/1941 Referring Provider (PT): Marguerita Beards, MD   Encounter Date: 12/29/2020   PT End of Session - 12/29/20 0955     Visit Number 2    Date for PT Re-Evaluation 03/15/21    Authorization Type meicare A/B    PT Start Time 0956    PT Stop Time 1045    PT Time Calculation (min) 49 min    Activity Tolerance Patient tolerated treatment well    Behavior During Therapy [facility] for tasks assessed/performed             Past Medical History:  Diagnosis Date   Arthritis    B/L knees   Hyperlipidemia    Neuromuscular disorder (HCC)    polymyalgia   Osteoporosis 06/12/2017   Thyroid disease     Past Surgical History:  Procedure Laterality Date   cataract surgery     ECTOPIC PREGNANCY SURGERY  1972    There were no vitals filed for this visit.   Subjective Assessment - 12/29/20 1001     Subjective Pt states she worked on her posutre and felt good with that.    Patient Stated Goals figre out the pain in the abdomen    Currently in Pain? No/denies                               [facility] Adult PT Treatment/Exercise - 12/29/20 0001       Neuro Re-ed    Neuro Re-ed Details  diaphragmatic breathing      Exercises   Exercises Lumbar      Lumbar Exercises: Stretches   Active Hamstring Stretch 2 reps;20 seconds    Single Knee to Chest Stretch 3 reps;10 seconds    Lower Trunk Rotation 5 reps;10 seconds    Pelvic Tilt 5 reps    Standing Side Bend Right;Left;5 reps    Standing Side Bend Limitations seated    Piriformis Stretch Left;Right;2 reps;20 seconds    Other Lumbar Stretch Exercise thoracic rotation - 3 x 20 sec                      PT Short Term Goals - 12/29/20 1051        PT SHORT TERM GOAL #1   Title ind with initil HEP    Baseline gave today    Status On-going               PT Long Term Goals - 12/21/20 1014       PT LONG TERM GOAL #1   Title Pt will be ind with advanced HEP    Time 12    Period Weeks    Status New    Target Date 03/15/21      PT LONG TERM GOAL #2   Title Pt will demonstrates single leg stand for 10 sec bil without trendelenburg    Time 12    Period Weeks    Status New    Target Date 03/15/21      PT LONG TERM GOAL #3   Title Pt will report at least 50% less pelvic pain    Time 12    Period Weeks    Status New  Target Date 03/15/21                   Plan - 12/29/20 1048     Clinical Impression Statement Pt did well with stretches to work towards her initial short term goal.  She needed verbal and tactile cues to ensure she was doing them correctly. Pt did not want to do anything internally today so today's session was focused on relaxing pelvic floor and stretching.  Pt demonstrates more tension throughout the left side.  Pt will benefit from skilled PT to continue to address posture and endurance for reduced symptoms from prolapse    PT Treatment/Interventions ADLs/Self Care Home Management;Biofeedback;Cryotherapy;Electrical Stimulation;Neuromuscular re-education;Therapeutic exercise;Therapeutic activities;Moist Heat;Patient/family education;Manual techniques;Dry needling;Taping;Passive range of motion    PT Next Visit Plan reposition polapse, kegel ex's in supine, review initial HEP add hip rotation stretch    PT Home Exercise Plan Access Code: [email or web address]    Consulted and Agree with Plan of Care Patient             Patient will benefit from skilled therapeutic intervention in order to improve the following deficits and impairments:  Pain, Decreased coordination, Decreased range of motion, Impaired tone, Decreased strength, Postural dysfunction  Visit Diagnosis: Unspecified lack of  coordination  Muscle weakness (generalized)     Problem List Patient Active Problem List   Diagnosis Date Noted   Knee osteoarthritis 06/13/2017   Osteoporosis 06/12/2017   Pre-diabetes 08/27/2016   Postablative hypothyroidism 05/09/2016   Dyslipidemia 05/09/2016   Pelvic organ prolapse quantification stage 1 cystocele 03/30/2016    Brayton Caves Tamaiya Bump, PT 12/29/2020, 10:59 AM  [facility] [address] [place], [place], [address] Phone: [phone]   Fax:  [phone]  Name: Courtney Ayala MRN: [id number] Date of Birth: May 28, 1941

## 2021-01-07 ENCOUNTER — Ambulatory Visit: Payer: Medicare Other | Admitting: Physical Therapy

## 2021-01-07 ENCOUNTER — Other Ambulatory Visit: Payer: Self-pay

## 2021-01-07 DIAGNOSIS — M6281 Muscle weakness (generalized): Secondary | ICD-10-CM

## 2021-01-07 DIAGNOSIS — R279 Unspecified lack of coordination: Secondary | ICD-10-CM | POA: Diagnosis not present

## 2021-01-07 NOTE — Therapy (Signed)
[facility] [address] [place], [place], [address] Phone: [phone]   Fax:  [phone]  Physical Therapy Treatment  Patient Details  Name: Courtney Ayala MRN: [id number] Date of Birth: 07/29/1940 Referring Provider (PT): Marguerita Beards, MD   Encounter Date: 01/07/2021   PT End of Session - 01/07/21 0930     Visit Number 3    Date for PT Re-Evaluation 03/15/21    Authorization Type meicare A/B    PT Start Time 0931    PT Stop Time 1012    PT Time Calculation (min) 41 min    Activity Tolerance Patient tolerated treatment well    Behavior During Therapy [facility] for tasks assessed/performed             Past Medical History:  Diagnosis Date   Arthritis    B/L knees   Hyperlipidemia    Neuromuscular disorder (HCC)    polymyalgia   Osteoporosis 06/12/2017   Thyroid disease     Past Surgical History:  Procedure Laterality Date   cataract surgery     ECTOPIC PREGNANCY SURGERY  1972    There were no vitals filed for this visit.   Subjective Assessment - 01/07/21 1010     Subjective Pt has been compliant with HEP    Currently in Pain? No/denies                               [facility] Adult PT Treatment/Exercise - 01/07/21 0001       Lumbar Exercises: Stretches   Single Knee to Chest Stretch 3 reps;10 seconds    Single Knee to Chest Stretch Limitations on foam roll    Lower Trunk Rotation 5 reps;10 seconds    Pelvic Tilt 5 reps    Piriformis Stretch Left;Right;2 reps;20 seconds    Other Lumbar Stretch Exercise thoracic rotation - 10x 5 sec                      PT Short Term Goals - 01/07/21 0954       PT SHORT TERM GOAL #1   Title ind with initil HEP    Status Achieved               PT Long Term Goals - 01/07/21 1011       PT LONG TERM GOAL #1   Title Pt will be ind with advanced HEP    Status On-going      PT LONG TERM GOAL #2   Title Pt will  demonstrates single leg stand for 10 sec bil without trendelenburg    Status On-going      PT LONG TERM GOAL #3   Title Pt will report at least 50% less pelvic pain    Status On-going                   Plan - 01/07/21 1008     Clinical Impression Statement Pt has tenderness with fascial release Lt side more than Rt.  Pt was able to tolerate addition of strengthening exercises today.  Pt still not noticing any changes.  pt has been compliant with HEP    PT Treatment/Interventions ADLs/Self Care Home Management;Biofeedback;Cryotherapy;Electrical Stimulation;Neuromuscular re-education;Therapeutic exercise;Therapeutic activities;Moist Heat;Patient/family education;Manual techniques;Dry needling;Taping;Passive range of motion    PT Next Visit Plan reposition polapse and tactile cues internal if tolerates or try biofeedback, kegel ex's in supine, review  HEP as needed    PT Home Exercise Plan Access Code: [email or web address]    Consulted and Agree with Plan of Care Patient             Patient will benefit from skilled therapeutic intervention in order to improve the following deficits and impairments:  Pain, Decreased coordination, Decreased range of motion, Impaired tone, Decreased strength, Postural dysfunction  Visit Diagnosis: Unspecified lack of coordination  Muscle weakness (generalized)     Problem List Patient Active Problem List   Diagnosis Date Noted   Knee osteoarthritis 06/13/2017   Osteoporosis 06/12/2017   Pre-diabetes 08/27/2016   Postablative hypothyroidism 05/09/2016   Dyslipidemia 05/09/2016   Pelvic organ prolapse quantification stage 1 cystocele 03/30/2016    Brayton Caves Solara Goodchild, PT 01/07/2021, 4:56 PM  [facility] [address] [place], [place], [address] Phone: [phone]   Fax:  [phone]  Name: Icel Castles MRN: [id number] Date of Birth: 05-26-1941

## 2021-01-14 ENCOUNTER — Encounter: Payer: Self-pay | Admitting: Physical Therapy

## 2021-01-14 ENCOUNTER — Ambulatory Visit: Payer: Medicare Other | Attending: Obstetrics and Gynecology | Admitting: Physical Therapy

## 2021-01-14 ENCOUNTER — Other Ambulatory Visit: Payer: Self-pay

## 2021-01-14 DIAGNOSIS — R279 Unspecified lack of coordination: Secondary | ICD-10-CM | POA: Insufficient documentation

## 2021-01-14 DIAGNOSIS — M6281 Muscle weakness (generalized): Secondary | ICD-10-CM | POA: Insufficient documentation

## 2021-01-14 NOTE — Therapy (Signed)
[facility] [address] [place], [place], [address] Phone: [phone]   Fax:  [phone]  Physical Therapy Treatment  Patient Details  Name: Courtney Ayala MRN: [id number] Date of Birth: 1940/08/21 Referring Provider (PT): Marguerita Beards, MD   Encounter Date: 01/14/2021   PT End of Session - 01/14/21 1014     Visit Number 4    Date for PT Re-Evaluation 03/15/21    Authorization Type meicare A/B    PT Start Time 1014    PT Stop Time 1052    PT Time Calculation (min) 38 min    Activity Tolerance Patient tolerated treatment well    Behavior During Therapy [facility] for tasks assessed/performed             Past Medical History:  Diagnosis Date   Arthritis    B/L knees   Hyperlipidemia    Neuromuscular disorder (HCC)    polymyalgia   Osteoporosis 06/12/2017   Thyroid disease     Past Surgical History:  Procedure Laterality Date   cataract surgery     ECTOPIC PREGNANCY SURGERY  1972    There were no vitals filed for this visit.   Subjective Assessment - 01/14/21 1014     Subjective Working on sitting up more straight.  The last couple of nights it seems like the pain hasn't been as bad.    Patient Stated Goals figre out the pain in the abdomen    Currently in Pain? No/denies                               [name] Adult PT Treatment/Exercise - 01/14/21 0001       Lumbar Exercises: Standing   Row 20 reps;Both;Theraband    Theraband Level (Row) Level 2 (Red)      Lumbar Exercises: Supine   Other Supine Lumbar Exercises lying on wedge pillows doing small bridges then breathing and relaxing between reps      Manual Therapy   Manual Therapy Myofascial release;Soft tissue mobilization    Soft tissue mobilization upper rectus abdominus    Myofascial Release abdominal release to stomach and liver regions;                      PT Short Term Goals - 01/07/21 0954        PT SHORT TERM GOAL #1   Title ind with initil HEP    Status Achieved               PT Long Term Goals - 01/07/21 1011       PT LONG TERM GOAL #1   Title Pt will be ind with advanced HEP    Status On-going      PT LONG TERM GOAL #2   Title Pt will demonstrates single leg stand for 10 sec bil without trendelenburg    Status On-going      PT LONG TERM GOAL #3   Title Pt will report at least 50% less pelvic pain    Status On-going                   Plan - 01/14/21 1047     Clinical Impression Statement Pt responded well to manaul fascial release today and had noticeably improved posture afterwards with less collapsing of ribcage towards the left. Pt was given cued to exhale with exertion while doing ex's.  Pt is making some progress and reports less pain this week.    PT Treatment/Interventions ADLs/Self Care Home Management;Biofeedback;Cryotherapy;Electrical Stimulation;Neuromuscular re-education;Therapeutic exercise;Therapeutic activities;Moist Heat;Patient/family education;Manual techniques;Dry needling;Taping;Passive range of motion    PT Next Visit Plan internally assess pelvic floor for goals, final HEP and add one more visit to follow up if needed    PT Home Exercise Plan Access Code: [email or web address]    Consulted and Agree with Plan of Care Patient             Patient will benefit from skilled therapeutic intervention in order to improve the following deficits and impairments:  Pain, Decreased coordination, Decreased range of motion, Impaired tone, Decreased strength, Postural dysfunction  Visit Diagnosis: Unspecified lack of coordination  Muscle weakness (generalized)     Problem List Patient Active Problem List   Diagnosis Date Noted   Knee osteoarthritis 06/13/2017   Osteoporosis 06/12/2017   Pre-diabetes 08/27/2016   Postablative hypothyroidism 05/09/2016   Dyslipidemia 05/09/2016   Pelvic organ prolapse quantification stage 1 cystocele  03/30/2016    Courtney Ayala Courtney Ayala, PT 01/14/2021, 12:02 PM  [facility] [address] [place], [place], [address] Phone: [phone]   Fax:  [phone]  Name: Courtney Ayala MRN: [id number] Date of Birth: [birth year]

## 2021-01-23 NOTE — Progress Notes (Addendum)
[facility] [address], [address] [place], [place] [address] [phone]  Date:  01/27/2021   Name:  Courtney Ayala   DOB:  1940-07-18   MRN:  [id number]  PCP:  [name], MD    Chief Complaint: Pre-Diabetes (A1c check, assess edema in legs/)   History of Present Illness:  Courtney Ayala is a 80 y.o. very pleasant female patient who presents with the following:  Here today for a follow-up visit - history of arthritis, osteoporosis, hypothyroidism, dyslipidemia, prediabetes Last seen by myself in February  She notes that she is staying busy around the house, but she still is staying in more due to covid pandemic  She is seeing Dr Florian Buff with urogyn for bladder prolapse- they have her doing some PT exercises which is she is enjoying, she thinks the exercises are helping her as well She is trying to eat a healthier diet as well- she has lost some weight through reducing sweets and carbs Wt Readings from Last 3 Encounters:  01/27/21 134 lb (60.8 kg)  10/14/20 144 lb (65.3 kg)  08/11/20 144 lb (65.3 kg)   Tetanus- she is not sure of last shot.  We will give her a booster  Shingrix- recommend to be done at pharmacy Mammo ordered-but not Scheduled  Dexa: will order for her along with mammo  Patient Active Problem List   Diagnosis Date Noted   Knee osteoarthritis 06/13/2017   Osteoporosis 06/12/2017   Pre-diabetes 08/27/2016   Postablative hypothyroidism 05/09/2016   Dyslipidemia 05/09/2016   Pelvic organ prolapse quantification stage 1 cystocele 03/30/2016    Past Medical History:  Diagnosis Date   Arthritis    B/L knees   Hyperlipidemia    Neuromuscular disorder (HCC)    polymyalgia   Osteoporosis 06/12/2017   Thyroid disease     Past Surgical History:  Procedure Laterality Date   cataract surgery     ECTOPIC PREGNANCY SURGERY  1972    Social History   Tobacco Use   Smoking status: Never   Smokeless  tobacco: Never  Vaping Use   Vaping Use: Never used  Substance Use Topics   Alcohol use: No   Drug use: No    Family History  Problem Relation Age of Onset   Hypertension Maternal Grandmother    Cancer Neg Hx    Stroke Neg Hx    Diabetes Neg Hx    Colon cancer Neg Hx    Breast cancer Neg Hx     No Known Allergies  Medication list has been reviewed and updated.  Current Outpatient Medications on File Prior to Visit  Medication Sig Dispense Refill   atorvastatin (LIPITOR) 10 MG tablet Take 1 tablet (10 mg total) by mouth daily. 90 tablet 3   Bromfenac Sodium (PROLENSA) 0.07 % SOLN Apply to eye in the morning and at bedtime.     Calcium Carbonate (CALCIUM 600 PO) Take 600 mg by mouth.     COVID-19 mRNA Vac-TriS, Pfizer, (PFIZER-BIONT COVID-19 VAC-TRIS) SUSP injection Inject into the muscle. 0.3 mL 0   estradiol (ESTRACE) 0.1 MG/GM vaginal cream Place 0.5 g vaginally 2 (two) times a week. Place 0.5g nightly for two weeks then twice a week after 30 g 11   Ferrous Sulfate (IRON) 325 (65 Fe) MG TABS Take by mouth.     levothyroxine (SYNTHROID) 88 MCG tablet Take 1 tablet (88 mcg total) by mouth daily before breakfast. 90 tablet  3   meloxicam (MOBIC) 7.5 MG tablet Take 1 tablet (7.5 mg total) by mouth daily. 15 tablet 0   Omega-3 Fatty Acids (FISH OIL) 1000 MG CAPS Take 1,000 mg by mouth.     vitamin B-12 (CYANOCOBALAMIN) 100 MCG tablet Take 100 mcg by mouth daily.     No current facility-administered medications on file prior to visit.    Review of Systems:  As per HPI- otherwise negative.   Physical Examination: Vitals:   01/27/21 0917  BP: 122/64  Pulse: 62  Resp: 15  Temp: (!) 97.1 F (36.2 C)  SpO2: 97%   Vitals:   01/27/21 0917  Weight: 134 lb (60.8 kg)  Height: 5\' 7"  (1.702 m)   Body mass index is 20.99 kg/m. Ideal Body Weight: Weight in (lb) to have BMI = 25: 159.3  GEN: no acute distress. Slim build, looks well  HEENT: Atraumatic, Normocephalic.  Ears  and Nose: No external deformity. CV: RRR, No M/G/R. No JVD. No thrill. No extra heart sounds. PULM: CTA B, no wheezes, crackles, rhonchi. No retractions. No resp. distress. No accessory muscle use. ABD: S, NT, ND, +BS. No rebound. No HSM. EXTR: No c/c/e PSYCH: Normally interactive. Conversant.  She has a tiny wound on the tip of her right great toe- healing ok, nothing else needed but will boost her tetanus   Assessment and Plan: Pre-diabetes - Plan: Hemoglobin A1c  Postablative hypothyroidism - Plan: TSH  Screening for hyperlipidemia - Plan: Lipid panel  Medication monitoring encounter - Plan: CBC, Comprehensive metabolic panel  Encounter for screening mammogram for malignant neoplasm of breast - Plan: MM 3D SCREEN BREAST BILATERAL  Estrogen deficiency - Plan: DG Bone Density  Immunization due - Plan: Td vaccine greater than or equal to 7yo preservative free IM  Open wound of toe, initial encounter - Plan: Td vaccine greater than or equal to 7yo preservative free IM  Wound of right lower extremity, initial encounter - Plan: Td vaccine greater than or equal to 7yo preservative free IM Ordered mammo and dexa Tetanus booster She has lost some weight through diet change- check A1c. Advised that no further weight loss is recommended  This visit occurred during the SARS-CoV-2 public health emergency.  Safety protocols were in place, including screening questions prior to the visit, additional usage of staff PPE, and extensive cleaning of exam room while observing appropriate contact time as indicated for disinfecting solutions.   Signed , MD  Received labs as below, message to pt  Results for orders placed or performed in visit on 01/27/21  CBC  Result Value Ref Range   WBC 3.3 (L) 4.0 - 10.5 K/uL   RBC 4.13 3.87 - 5.11 Mil/uL   Platelets 152.0 150.0 - 400.0 K/uL   Hemoglobin 13.3 12.0 - 15.0 g/dL   HCT 01/29/21 11.0 - 31.5 %   MCV 95.8 78.0 - 100.0 fl   MCHC 33.6  30.0 - 36.0 g/dL   RDW 94.5 85.9 - 29.2 %  Comprehensive metabolic panel  Result Value Ref Range   Sodium 140 135 - 145 mEq/L   Potassium 3.9 3.5 - 5.1 mEq/L   Chloride 103 96 - 112 mEq/L   CO2 28 19 - 32 mEq/L   Glucose, Bld 94 70 - 99 mg/dL   BUN 13 6 - 23 mg/dL   Creatinine, Ser 44.6 0.40 - 1.20 mg/dL   Total Bilirubin 0.8 0.2 - 1.2 mg/dL   Alkaline Phosphatase 75 39 - 117 U/L  AST 18 0 - 37 U/L   ALT 12 0 - 35 U/L   Total Protein 7.1 6.0 - 8.3 g/dL   Albumin 4.3 3.5 - 5.2 g/dL   GFR 40.97 >35.32 mL/min   Calcium 9.7 8.4 - 10.5 mg/dL  Lipid panel  Result Value Ref Range   Cholesterol 215 (H) 0 - 200 mg/dL   Triglycerides 99.2 0.0 - 149.0 mg/dL   HDL 42.68 >34.19 mg/dL   VLDL 62.2 0.0 - 29.7 mg/dL   LDL Cholesterol 989 (H) 0 - 99 mg/dL   Total CHOL/HDL Ratio 2    NonHDL 126.76   Hemoglobin A1c  Result Value Ref Range   Hgb A1c MFr Bld 6.1 4.6 - 6.5 %  TSH  Result Value Ref Range   TSH 0.45 0.35 - 5.50 uIU/mL

## 2021-01-23 NOTE — Patient Instructions (Addendum)
Good to see you again! Assuming all is well please see me in about 6 months  You got your tetanus booster today Please also consider getting the shingles vaccine series if not done already- this would be given at your pharmacy After your labs please stop by the ground floor imaging dept to schedule your mammogram and bone density

## 2021-01-25 ENCOUNTER — Other Ambulatory Visit: Payer: Self-pay

## 2021-01-25 ENCOUNTER — Encounter (INDEPENDENT_AMBULATORY_CARE_PROVIDER_SITE_OTHER): Payer: Medicare Other | Admitting: Ophthalmology

## 2021-01-25 DIAGNOSIS — H353132 Nonexudative age-related macular degeneration, bilateral, intermediate dry stage: Secondary | ICD-10-CM | POA: Diagnosis not present

## 2021-01-25 DIAGNOSIS — H59032 Cystoid macular edema following cataract surgery, left eye: Secondary | ICD-10-CM | POA: Diagnosis not present

## 2021-01-25 DIAGNOSIS — H43813 Vitreous degeneration, bilateral: Secondary | ICD-10-CM

## 2021-01-27 ENCOUNTER — Other Ambulatory Visit: Payer: Self-pay

## 2021-01-27 ENCOUNTER — Ambulatory Visit (INDEPENDENT_AMBULATORY_CARE_PROVIDER_SITE_OTHER): Payer: Medicare Other | Admitting: Family Medicine

## 2021-01-27 ENCOUNTER — Encounter: Payer: Self-pay | Admitting: Family Medicine

## 2021-01-27 VITALS — BP 122/64 | HR 62 | Temp 97.1°F | Resp 15 | Ht 67.0 in | Wt 134.0 lb

## 2021-01-27 DIAGNOSIS — Z13220 Encounter for screening for lipoid disorders: Secondary | ICD-10-CM | POA: Diagnosis not present

## 2021-01-27 DIAGNOSIS — E890 Postprocedural hypothyroidism: Secondary | ICD-10-CM

## 2021-01-27 DIAGNOSIS — Z5181 Encounter for therapeutic drug level monitoring: Secondary | ICD-10-CM

## 2021-01-27 DIAGNOSIS — S81801A Unspecified open wound, right lower leg, initial encounter: Secondary | ICD-10-CM

## 2021-01-27 DIAGNOSIS — Z1231 Encounter for screening mammogram for malignant neoplasm of breast: Secondary | ICD-10-CM

## 2021-01-27 DIAGNOSIS — R7303 Prediabetes: Secondary | ICD-10-CM | POA: Diagnosis not present

## 2021-01-27 DIAGNOSIS — S91109A Unspecified open wound of unspecified toe(s) without damage to nail, initial encounter: Secondary | ICD-10-CM

## 2021-01-27 DIAGNOSIS — Z23 Encounter for immunization: Secondary | ICD-10-CM

## 2021-01-27 DIAGNOSIS — E2839 Other primary ovarian failure: Secondary | ICD-10-CM

## 2021-01-27 LAB — LIPID PANEL
Cholesterol: 215 mg/dL — ABNORMAL HIGH (ref 0–200)
HDL: 88.5 mg/dL (ref >39.00)
LDL Cholesterol: 117 mg/dL — ABNORMAL HIGH (ref 0–99)
NonHDL: 126.76
Total CHOL/HDL Ratio: 2
Triglycerides: 50 mg/dL (ref 0.0–149.0)
VLDL: 10 mg/dL (ref 0.0–40.0)

## 2021-01-27 LAB — COMPREHENSIVE METABOLIC PANEL
ALT: 12 U/L (ref 0–35)
AST: 18 U/L (ref 0–37)
Albumin: 4.3 g/dL (ref 3.5–5.2)
Alkaline Phosphatase: 75 U/L (ref 39–117)
BUN: 13 mg/dL (ref 6–23)
CO2: 28 mEq/L (ref 19–32)
Calcium: 9.7 mg/dL (ref 8.4–10.5)
Chloride: 103 mEq/L (ref 96–112)
Creatinine, Ser: 0.87 mg/dL (ref 0.40–1.20)
GFR: 62.32 mL/min (ref >60.00)
Glucose, Bld: 94 mg/dL (ref 70–99)
Potassium: 3.9 mEq/L (ref 3.5–5.1)
Sodium: 140 mEq/L (ref 135–145)
Total Bilirubin: 0.8 mg/dL (ref 0.2–1.2)
Total Protein: 7.1 g/dL (ref 6.0–8.3)

## 2021-01-27 LAB — CBC
HCT: 39.5 % (ref 36.0–46.0)
Hemoglobin: 13.3 g/dL (ref 12.0–15.0)
MCHC: 33.6 g/dL (ref 30.0–36.0)
MCV: 95.8 fl (ref 78.0–100.0)
Platelets: 152 10*3/uL (ref 150.0–400.0)
RBC: 4.13 Mil/uL (ref 3.87–5.11)
RDW: 12.9 % (ref 11.5–15.5)
WBC: 3.3 10*3/uL — ABNORMAL LOW (ref 4.0–10.5)

## 2021-01-27 LAB — TSH: TSH: 0.45 u[IU]/mL (ref 0.35–5.50)

## 2021-01-27 LAB — HEMOGLOBIN A1C: Hgb A1c MFr Bld: 6.1 % (ref 4.6–6.5)

## 2021-01-28 ENCOUNTER — Ambulatory Visit: Payer: Medicare Other | Admitting: Physical Therapy

## 2021-01-28 DIAGNOSIS — R279 Unspecified lack of coordination: Secondary | ICD-10-CM

## 2021-01-28 DIAGNOSIS — M6281 Muscle weakness (generalized): Secondary | ICD-10-CM

## 2021-01-28 NOTE — Patient Instructions (Signed)
Access Code: [email or web address] URL: [email or web address] Date: 01/28/2021 Prepared by: Dwana Curd  Exercises Seated Correct Posture - 1 x daily - 7 x weekly - 3 sets - 10 reps Supine Lower Trunk Rotation - 1 x daily - 7 x weekly - 10 reps - 1 sets - 5 sec hold Supine Figure 4 Piriformis Stretch - 1 x daily - 7 x weekly - 3 reps - 1 sets - 30 sec hold Seated Sidebending Arms Overhead - 1 x daily - 7 x weekly - 3 sets - 10 reps Sidelying Thoracic Rotation with Open Book - 1 x daily - 7 x weekly - 5 reps - 1 sets - 10 sec hold Supine Diaphragmatic Breathing with Pelvic Floor Lengthening - 3 x daily - 7 x weekly - 10 reps - 1 sets Supine Hamstring Stretch with Strap - 1 x daily - 7 x weekly - 3 reps - 1 sets - 30 sec hold Supine Bridge with Mini Swiss Ball Between Knees - 1 x daily - 7 x weekly - 3 sets - 10 reps Standing Bilateral Low Shoulder Row with Anchored Resistance - 1 x daily - 7 x weekly - 3 sets - 10 reps Single Leg Stance with Support - 1 x daily - 7 x weekly - 1 sets - 3 reps - 10 sec hold Standing Hip Abduction with Counter Support - 1 x daily - 7 x weekly - 3 sets - 10 reps

## 2021-01-28 NOTE — Therapy (Signed)
[facility] [address] [place], [place], [address] Phone: [phone]   Fax:  [phone]  Physical Therapy Treatment  Patient Details  Name: Courtney Ayala MRN: [id number] Date of Birth: [birth year] Referring Provider (PT): Jaquita Folds, MD   Encounter Date: 01/28/2021   PT End of Session - 01/28/21 1031     Visit Number 5    Date for PT Re-Evaluation 03/15/21    Authorization Type meicare A/B    PT Start Time 1015    PT Stop Time 1055    PT Time Calculation (min) 40 min    Activity Tolerance Patient tolerated treatment well    Behavior During Therapy [facility] for tasks assessed/performed             Past Medical History:  Diagnosis Date   Arthritis    B/L knees   Hyperlipidemia    Neuromuscular disorder ([place])    polymyalgia   Osteoporosis 06/12/2017   Thyroid disease     Past Surgical History:  Procedure Laterality Date   cataract surgery     ECTOPIC PREGNANCY SURGERY  1972    There were no vitals filed for this visit.   Subjective Assessment - 01/28/21 1017     Subjective When I get up and start moving I feel good. In bed at night I wake up sometimes with pain and have had more nights without pain.  I had 2 nights last week when things didn't fall down as quickly.    Patient Stated Goals figre out the pain in the abdomen    Currently in Pain? No/denies                               [facility] Adult PT Treatment/Exercise - 01/28/21 0001       Lumbar Exercises: Stretches   Piriformis Stretch Left;Right;2 reps;20 seconds    Other Lumbar Stretch Exercise thoracic rotation and trunk rotation- 10x 5 sec      Lumbar Exercises: Standing   Other Standing Lumbar Exercises single leg standing x 10 sec with UEsupport x1 - 3x each    Other Standing Lumbar Exercises standing hip abd 15x each side      Lumbar Exercises: Supine   Bridge with Ball Squeeze 20 reps;5 seconds   resting  btwn     Manual Therapy   Myofascial Release abdominal release to stomach and liver regions; and lower abdomen Lt>Rt                    PT Education - 01/28/21 1031     Education Details Access Code: [email or web address]    Person(s) Educated Patient    Methods Explanation;Demonstration;Tactile cues;Verbal cues;Handout    Comprehension Verbalized understanding;Returned demonstration              PT Short Term Goals - 01/07/21 0954       PT SHORT TERM GOAL #1   Title ind with initil HEP    Status Achieved               PT Long Term Goals - 01/28/21 1022       PT LONG TERM GOAL #1   Title Pt will be ind with advanced HEP    Status On-going      PT LONG TERM GOAL #2   Title Pt will demonstrates single leg stand for 10 sec bil without trendelenburg  Baseline trendelenburg corrected with UE support    Status On-going      PT LONG TERM GOAL #3   Title Pt will report at least 75% less pelvic pain    Baseline It is 50% better which was initial goal, so goal revised based on her progress is expected to continue    Status Revised                   Plan - 01/28/21 1110     Clinical Impression Statement Pt was able to progress strength exercises today.  She is doing excellent and has noticed more progress this week with less pain overall.  She met long term goal for pain but is excpected to continue to have less pain as she has been able to progress her strength. She is also still responding well with myofascial release around ribcage and had more mobility for side bend and rotation after manual release.  Pt is recommended to continue skilled PT to progress for reduced pain and improved posture and reduced risk of complications with prolapse    PT Treatment/Interventions ADLs/Self Care Home Management;Biofeedback;Cryotherapy;Electrical Stimulation;Neuromuscular re-education;Therapeutic exercise;Therapeutic activities;Moist Heat;Patient/family education;Manual  techniques;Dry needling;Taping;Passive range of motion    PT Next Visit Plan re-assess, myofascial release for posture, gluteal strength and re-check single leg stand without trendelenburg    PT Home Exercise Plan Access Code: [email or web address]    Consulted and Agree with Plan of Care Patient             Patient will benefit from skilled therapeutic intervention in order to improve the following deficits and impairments:  Pain, Decreased coordination, Decreased range of motion, Impaired tone, Decreased strength, Postural dysfunction  Visit Diagnosis: Unspecified lack of coordination  Muscle weakness (generalized)     Problem List Patient Active Problem List   Diagnosis Date Noted   Knee osteoarthritis 06/13/2017   Osteoporosis 06/12/2017   Pre-diabetes 08/27/2016   Postablative hypothyroidism 05/09/2016   Dyslipidemia 05/09/2016   Pelvic organ prolapse quantification stage 1 cystocele 03/30/2016    Camillo Flaming Zoraya Fiorenza, PT 01/28/2021, 11:32 AM  [facility] [address] [place], [place], [address] Phone: [phone]   Fax:  [phone]  Name: Courtney Ayala MRN: [id number] Date of Birth: 13-Sep-1940

## 2021-03-01 ENCOUNTER — Ambulatory Visit: Payer: Medicare Other | Admitting: Physical Therapy

## 2021-03-04 ENCOUNTER — Telehealth: Payer: Self-pay | Admitting: Family Medicine

## 2021-03-04 ENCOUNTER — Other Ambulatory Visit: Payer: Self-pay

## 2021-03-04 DIAGNOSIS — E890 Postprocedural hypothyroidism: Secondary | ICD-10-CM

## 2021-03-04 MED ORDER — LEVOTHYROXINE SODIUM 88 MCG PO TABS: 88.0000 ug | ORAL_TABLET | Freq: Every day | ORAL | 3 refills | Status: DC

## 2021-03-04 NOTE — Telephone Encounter (Signed)
Medication: levothyroxine (SYNTHROID) 88 MCG tablet   Has the patient contacted their pharmacy? No. (If no, request that the patient contact the pharmacy for the refill.) (If yes, when and what did the pharmacy advise?)  Preferred Pharmacy (with phone number or street name): [facility] - [place], [place] - [address]  [address] [place] [address]  Phone:  [phone]    Agent: Please be advised that RX refills may take up to 3 business days. We ask that you follow-up with your pharmacy.

## 2021-03-04 NOTE — Telephone Encounter (Signed)
Refill sent.

## 2021-03-06 ENCOUNTER — Other Ambulatory Visit: Payer: Self-pay | Admitting: Family Medicine

## 2021-03-06 DIAGNOSIS — E785 Hyperlipidemia, unspecified: Secondary | ICD-10-CM

## 2021-03-15 ENCOUNTER — Other Ambulatory Visit: Payer: Self-pay

## 2021-03-15 ENCOUNTER — Encounter (INDEPENDENT_AMBULATORY_CARE_PROVIDER_SITE_OTHER): Payer: Medicare Other | Admitting: Ophthalmology

## 2021-03-15 DIAGNOSIS — H353132 Nonexudative age-related macular degeneration, bilateral, intermediate dry stage: Secondary | ICD-10-CM

## 2021-03-15 DIAGNOSIS — H59032 Cystoid macular edema following cataract surgery, left eye: Secondary | ICD-10-CM | POA: Diagnosis not present

## 2021-03-15 DIAGNOSIS — H43813 Vitreous degeneration, bilateral: Secondary | ICD-10-CM | POA: Diagnosis not present

## 2021-03-16 ENCOUNTER — Ambulatory Visit: Payer: Medicare Other | Admitting: Physical Therapy

## 2021-03-31 ENCOUNTER — Other Ambulatory Visit: Payer: Self-pay

## 2021-03-31 ENCOUNTER — Encounter: Payer: Self-pay | Admitting: Physical Therapy

## 2021-03-31 ENCOUNTER — Ambulatory Visit: Payer: Medicare Other | Attending: Obstetrics and Gynecology | Admitting: Physical Therapy

## 2021-03-31 DIAGNOSIS — R279 Unspecified lack of coordination: Secondary | ICD-10-CM | POA: Insufficient documentation

## 2021-03-31 DIAGNOSIS — M6281 Muscle weakness (generalized): Secondary | ICD-10-CM | POA: Insufficient documentation

## 2021-03-31 NOTE — Therapy (Signed)
[name], [place], [address] Phone: [phone]   Fax:  [phone]  Physical Therapy Treatment  Patient Details  Name: Courtney Ayala MRN: [id number] Date of Birth: July 02, 1940 Referring Provider (PT): Jaquita Folds, MD   Encounter Date: 03/31/2021   PT End of Session - 03/31/21 1248     Visit Number 6    Date for PT Re-Evaluation 06/23/21    Authorization Type meicare A/B    PT Start Time 1231    PT Stop Time 1311    PT Time Calculation (min) 40 min    Activity Tolerance Patient tolerated treatment well    Behavior During Therapy [facility] for tasks assessed/performed             Past Medical History:  Diagnosis Date   Arthritis    B/L knees   Hyperlipidemia    Neuromuscular disorder ([place])    polymyalgia   Osteoporosis 06/12/2017   Thyroid disease     Past Surgical History:  Procedure Laterality Date   cataract surgery     ECTOPIC PREGNANCY SURGERY  1972    There were no vitals filed for this visit.   Subjective Assessment - 03/31/21 1237     Subjective I had my eye surgery so I couldn't exercise as much.  I still have pain in the abdomen but not all the time like it was before.  I still have the prolapse but I notice with breathing I can reposition it.  I haven't been waking up at night.  I still walk and use the exercise bike at least 2x    Patient Stated Goals figre out the pain in the abdomen    Currently in Pain? No/denies                            Pelvic Floor Special Questions - 03/31/21 0001     Strength # of seconds 6   assessed with tactile cues externally              [facility] Adult PT Treatment/Exercise - 03/31/21 0001       Neuro Re-ed    Neuro Re-ed Details  breathing while kegel and tactile cues externally to work on contract and rest for appropriate lengths of time (3-6 sec each)      Lumbar Exercises: Stretches   Other Lumbar  Stretch Exercise supine on wedge pillow with breathing for repositioning      Lumbar Exercises: Supine   Bridge with Ball Squeeze 20 reps;5 seconds   resting btwn   Other Supine Lumbar Exercises kegel with holding 3-6 seconds cues for resting and holding and trasitioned to doing without cues                       PT Short Term Goals - 01/07/21 0954       PT SHORT TERM GOAL #1   Title ind with initil HEP    Status Achieved               PT Long Term Goals - 03/31/21 1244       PT LONG TERM GOAL #1   Title Pt will be ind with advanced HEP    Baseline I can exercise and get it back into place and it doesn't fall down at night like it used to    Time 12  Period Weeks    Status On-going    Target Date 06/23/21      PT LONG TERM GOAL #2   Title Pt will demonstrates single leg stand for 10 sec bil without trendelenburg    Status On-going    Target Date 06/23/21      PT LONG TERM GOAL #3   Title Pt will report at least 75% less pelvic pain    Baseline It improved 60-75%    Status Partially Met                   Plan - 03/31/21 1336     Clinical Impression Statement Pt was last seen 2 months ago due to scheduling and pt had an eye surgery.  Since then she has not been able to do quite as much.  She has still been able to demontrate progress with kegel holding for 6 sec and at least 60% less pain.  Pt is noticing less symtpoms of prolapse during the nighttime. Pt has prolapse after45 minutes of being up on her feet but is doing better at getting things repositioned. Pt will benefit from skilled PT to continue to work on strength and endurance as she is expected to be able to make more improvements and progress exercises to greater level of difficulty.    PT Frequency Biweekly    PT Duration 12 weeks    PT Treatment/Interventions ADLs/Self Care Home Management;Biofeedback;Cryotherapy;Electrical Stimulation;Neuromuscular re-education;Therapeutic  exercise;Therapeutic activities;Moist Heat;Patient/family education;Manual techniques;Dry needling;Taping;Passive range of motion    PT Next Visit Plan re-assess abiltiy to hold kegel up from 6 sec; gluteal and core strength - single leg standing    PT Home Exercise Plan Access Code: [email or web address]    Consulted and Agree with Plan of Care Patient             Patient will benefit from skilled therapeutic intervention in order to improve the following deficits and impairments:  Pain, Decreased coordination, Decreased range of motion, Impaired tone, Decreased strength, Postural dysfunction  Visit Diagnosis: Unspecified lack of coordination  Muscle weakness (generalized)     Problem List Patient Active Problem List   Diagnosis Date Noted   Knee osteoarthritis 06/13/2017   Osteoporosis 06/12/2017   Pre-diabetes 08/27/2016   Postablative hypothyroidism 05/09/2016   Dyslipidemia 05/09/2016   Pelvic organ prolapse quantification stage 1 cystocele 03/30/2016    Jule Ser, PT 03/31/2021, 1:46 PM  [facility] @ [place], [place], [address] Phone: [phone]   Fax:  [phone]  Name: Courtney Ayala MRN: [id number] Date of Birth: [birth year]

## 2021-04-05 ENCOUNTER — Encounter (HOSPITAL_BASED_OUTPATIENT_CLINIC_OR_DEPARTMENT_OTHER): Payer: Self-pay

## 2021-04-05 ENCOUNTER — Ambulatory Visit (HOSPITAL_BASED_OUTPATIENT_CLINIC_OR_DEPARTMENT_OTHER)
Admission: RE | Admit: 2021-04-05 | Discharge: 2021-04-05 | Disposition: A | Discharge status: Home / Self Care | Payer: Medicare Other | Source: Ambulatory Visit | Attending: Family Medicine | Admitting: Family Medicine

## 2021-04-05 ENCOUNTER — Other Ambulatory Visit: Payer: Self-pay

## 2021-04-05 DIAGNOSIS — E2839 Other primary ovarian failure: Secondary | ICD-10-CM | POA: Insufficient documentation

## 2021-04-05 DIAGNOSIS — Z1231 Encounter for screening mammogram for malignant neoplasm of breast: Secondary | ICD-10-CM

## 2021-04-06 ENCOUNTER — Encounter: Payer: Self-pay | Admitting: Family Medicine

## 2021-04-08 ENCOUNTER — Other Ambulatory Visit: Payer: Self-pay | Admitting: Obstetrics and Gynecology

## 2021-04-08 DIAGNOSIS — R3915 Urgency of urination: Secondary | ICD-10-CM

## 2021-04-08 DIAGNOSIS — N3000 Acute cystitis without hematuria: Secondary | ICD-10-CM

## 2021-04-15 ENCOUNTER — Telehealth: Payer: Self-pay | Admitting: Family Medicine

## 2021-04-15 NOTE — Telephone Encounter (Signed)
Left message for patient to call back and schedule Medicare Annual Wellness Visit (AWV) in office.  ° °If not able to come in office, please offer to do virtually or by telephone.  Left office number and my jabber #[phone]. ° °Due for AWVI ° °Please schedule at anytime with Nurse Health Advisor. °  °

## 2021-04-20 ENCOUNTER — Encounter: Payer: Self-pay | Admitting: Family Medicine

## 2021-04-26 ENCOUNTER — Other Ambulatory Visit: Payer: Self-pay

## 2021-04-26 ENCOUNTER — Encounter (INDEPENDENT_AMBULATORY_CARE_PROVIDER_SITE_OTHER): Payer: Medicare Other | Admitting: Ophthalmology

## 2021-04-26 DIAGNOSIS — H59032 Cystoid macular edema following cataract surgery, left eye: Secondary | ICD-10-CM | POA: Diagnosis not present

## 2021-04-26 DIAGNOSIS — H353132 Nonexudative age-related macular degeneration, bilateral, intermediate dry stage: Secondary | ICD-10-CM

## 2021-04-26 DIAGNOSIS — H43813 Vitreous degeneration, bilateral: Secondary | ICD-10-CM

## 2021-04-27 ENCOUNTER — Ambulatory Visit: Payer: Medicare Other | Attending: Obstetrics and Gynecology | Admitting: Physical Therapy

## 2021-04-27 ENCOUNTER — Encounter: Payer: Self-pay | Admitting: Physical Therapy

## 2021-04-27 DIAGNOSIS — M6281 Muscle weakness (generalized): Secondary | ICD-10-CM | POA: Diagnosis present

## 2021-04-27 DIAGNOSIS — R279 Unspecified lack of coordination: Secondary | ICD-10-CM | POA: Insufficient documentation

## 2021-04-27 NOTE — Patient Instructions (Addendum)
It was good to see you again today!   We will check your urine culture and get in touch with you  I will refer you to orthopedics to look at your knee. In the meantime use meloxicam once a day as needed.  Ice may also be helpful!   Flu shot today I would recommend getting the latest covid booster if not done already, and the shingles vaccine at your pharmacy

## 2021-04-27 NOTE — Addendum Note (Signed)
Addended by: Beatris Si on: 04/27/2021 03:11 PM   Modules accepted: Orders

## 2021-04-27 NOTE — Therapy (Addendum)
[facility] @ [facility] [place], [place], [address] Phone: [phone]   Fax:  [phone]  Physical Therapy Treatment  Patient Details  Name: Courtney Ayala MRN: [id number] Date of Birth: April 05, 1941 Referring Provider (PT): Jaquita Folds, MD   Encounter Date: 04/27/2021   PT End of Session - 04/27/21 1029     Visit Number 7    Date for PT Re-Evaluation 07/20/21    Authorization Type meicare A/B    PT Start Time 1016    PT Stop Time 1101    PT Time Calculation (min) 45 min    Activity Tolerance Patient tolerated treatment well    Behavior During Therapy [facility] for tasks assessed/performed             Past Medical History:  Diagnosis Date   Arthritis    B/L knees   Hyperlipidemia    Neuromuscular disorder ([place])    polymyalgia   Osteoporosis 06/12/2017   Thyroid disease     Past Surgical History:  Procedure Laterality Date   cataract surgery     ECTOPIC PREGNANCY SURGERY  1972    There were no vitals filed for this visit.   Subjective Assessment - 04/27/21 1021     Subjective I haven't been doing a whole lot because my knee has been hurting.  I am going to the doctor for that tomorrow    Patient Stated Goals reduced feeling of prolapse    Currently in Pain? No/denies                [facility] PT Assessment - 04/27/21 0001       Assessment   Medical Diagnosis N81.3 (ICD-10-CM) - Uterovaginal prolapse, complete    Referring Provider (PT) Jaquita Folds, MD      Functional Tests   Functional tests Single leg stance      Single Leg Stance   Comments trendelenburg Lt, instability bil 7-8 sec hold   immproved     Strength   Strength Assessment Site Hip;Knee    Right/Left Hip Right;Left    Right Hip Flexion 5/5    Right Hip External Rotation  5/5    Left Hip Flexion 4-/5    Left Hip External Rotation 4-/5    Right/Left Knee Right;Left    Right Knee Flexion 5/5    Right Knee  Extension 5/5    Left Knee Flexion 4/5    Left Knee Extension 4/5      Flexibility   Soft Tissue Assessment /Muscle Length yes    Hamstrings limited knee extension Left and painful when doing Lt LE extension      Palpation   Palpation comment TTP medial knee joint line and posterior knee capsule Lt LE      Ambulation/Gait   Gait Pattern Antalgic                           [facility] Adult PT Treatment/Exercise - 04/27/21 0001       Self-Care   Self-Care Other Self-Care Comments    Other Self-Care Comments  educated and performed exercises as seen in HEP and below for improved Lt knee strength - without LE strength gait and functional movments off and will affect the pelvic floor for correctly engaging      Lumbar Exercises: Stretches   Other Lumbar Stretch Exercise knee flexion on steps      Lumbar Exercises: Seated  Other Seated Lumbar Exercises knee flexion/extension      Lumbar Exercises: Supine   Heel Slides 15 reps    Other Supine Lumbar Exercises knee flexion and quad set - 15x                     PT Education - 04/27/21 1506     Education Details added knee flexion and extension ROM, hamstring stretch on medgridge    Person(s) Educated Patient    Methods Explanation;Demonstration;Tactile cues;Verbal cues;Handout    Comprehension Verbalized understanding;Returned demonstration              PT Short Term Goals - 01/07/21 0954       PT SHORT TERM GOAL #1   Title ind with initil HEP    Status Achieved               PT Long Term Goals - 04/27/21 1030       PT LONG TERM GOAL #1   Title Pt will be ind with advanced HEP    Baseline doing okay but has been having set backs that are causing her to have difficulty progressing    Time 12    Period Weeks    Status On-going    Target Date 07/20/21      PT LONG TERM GOAL #2   Title Pt will demonstrates single leg stand for 10 sec bil without trendelenburg    Baseline 7 sec Lt  LE; 8 sec Rt LE (up from 5 sec)    Time 12    Period Weeks    Status Partially Met    Target Date 07/20/21      PT LONG TERM GOAL #3   Title Pt will report at least 75% less pelvic pain    Baseline 75% improved    Status Achieved      PT LONG TERM GOAL #4   Title Pt will be able to walk for 5 minutes without feeling the prolapse falling    Baseline just a few steps    Time 12    Period Weeks    Status New    Target Date 07/20/21      PT LONG TERM GOAL #5   Title Pt will have 5/5 knee and hip strength bil for improved gait mechanics causing less assymetrical stresses on the pelvic floor    Time 12    Period Weeks    Status New    Target Date 07/20/21                   Plan - 04/27/21 1449     Clinical Impression Statement Pt was re-assessed today but took most of the assessment time on Lt LE due to knee pain that was causing her to have an antalgic gait and uneven weight distribution.  At this time she has met one of her long term goals with regard to pain.  She can still feel the prolapse in the mornings after walking around the house a few steps.  She states it is better at night after lying down and not being up as long.  Pt has improved her single leg stance with greater time in standing.  She is presenting with abnormal gait and Rt LE weakness and pain . Pt will benefit from skilled PT to address strength and gait abnormalities to regain more functional activities with decreased risk for further prolapse.    Personal Factors and Comorbidities Age;Time since  onset of injury/illness/exacerbation    Examination-Activity Limitations Toileting    Stability/Clinical Decision Making Evolving/Moderate complexity    PT Frequency Biweekly    PT Duration 12 weeks    PT Treatment/Interventions ADLs/Self Care Home Management;Biofeedback;Cryotherapy;Electrical Stimulation;Neuromuscular re-education;Therapeutic exercise;Therapeutic activities;Moist Heat;Patient/family  education;Manual techniques;Dry needling;Taping;Passive range of motion    PT Next Visit Plan may require another referral for knee in order to make progress with the pelvic strength; re-assess abiltiy to hold kegel up from 6 sec; gluteal and core strength - single leg standing    PT Home Exercise Plan Access Code: [email or web address]    Recommended Other Services Access Code: [email or web address]    Consulted and Agree with Plan of Care Patient             Patient will benefit from skilled therapeutic intervention in order to improve the following deficits and impairments:  Pain, Decreased coordination, Decreased range of motion, Impaired tone, Decreased strength, Postural dysfunction  Visit Diagnosis: Unspecified lack of coordination  Muscle weakness (generalized)     Problem List Patient Active Problem List   Diagnosis Date Noted   Knee osteoarthritis 06/13/2017   Osteoporosis 06/12/2017   Pre-diabetes 08/27/2016   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 Patient goals were partially met. Patient is being discharged due to not returning since the last visit.  Gustavus Bryant, PT 06/28/21 10:07 AM

## 2021-04-27 NOTE — Progress Notes (Addendum)
[facility] at [facility] [address], [address] [place], [place] [address] [phone]  Date:  04/28/2021   Name:  Courtney Ayala   DOB:  1941/02/11   MRN:  [id number]  PCP:  [name], MD    Chief Complaint: Follow-up (Concerns/ questions: 1. pt has been having some left knee pain x 1.5 weeks. 2. Pt would like a UA- she has been experiencing some burning with urination. /Flu shot today: yes/)   History of Present Illness:  Courtney Ayala is a 80 y.o. very pleasant female patient who presents with the following:  Patient is here today for follow-up visit Most recently seen by myself in August of this year- history of arthritis, osteoporosis, hypothyroidism, dyslipidemia, prediabetes  Shingles vaccine COVID updated booster- recommended  Flu shot- give today   Her DEXA scan in October did show osteoporosis, I message patient with suggestion that we start an oral medication such as Fosamax.  She had not yet replied to me We discussed this today- she is not convinced about starting on medication, she is concerned about side effects.  I advised her that certainly she does not have to start a medication but I am worried about her increased fracture risk I encouraged her to think about starting bisphosphonate, in the meantime continue weightbearing exercise and take a calcium and vitamin D supplement  Lab Results  Component Value Date   TSH 0.45 01/27/2021   She has noted left knee pain for about 10 days  She has started PT- they want to see her every couple of weeks  She has tried various topical remedies as well as ice and heat, she is wearing a knee brace She is not aware of any particular injury to the knee, has not yet seen orthopedics for this  She also has noted possible urinary frequency and would like to rule out a urinary tract infection  Patient Active Problem List   Diagnosis Date Noted   Knee osteoarthritis 06/13/2017    Osteoporosis 06/12/2017   Pre-diabetes 08/27/2016   Postablative hypothyroidism 05/09/2016   Dyslipidemia 05/09/2016   Pelvic organ prolapse quantification stage 1 cystocele 03/30/2016    Past Medical History:  Diagnosis Date   Arthritis    B/L knees   Hyperlipidemia    Neuromuscular disorder ([facility])    polymyalgia   Osteoporosis 06/12/2017   Thyroid disease     Past Surgical History:  Procedure Laterality Date   cataract surgery     ECTOPIC PREGNANCY SURGERY  1972    Social History   Tobacco Use   Smoking status: Never   Smokeless tobacco: Never  Vaping Use   Vaping Use: Never used  Substance Use Topics   Alcohol use: No   Drug use: No    Family History  Problem Relation Age of Onset   Hypertension Maternal Grandmother    Cancer Neg Hx    Stroke Neg Hx    Diabetes Neg Hx    Colon cancer Neg Hx    Breast cancer Neg Hx     No Known Allergies  Medication list has been reviewed and updated.  Current Outpatient Medications on File Prior to Visit  Medication Sig Dispense Refill   atorvastatin (LIPITOR) 10 MG tablet TAKE 1 TABLET DAILY 90 tablet 1   Bromfenac Sodium (PROLENSA) 0.07 % SOLN Apply to eye in the morning and at bedtime.     Calcium Carbonate (CALCIUM 600 PO) Take 600  mg by mouth.     CRANBERRY PO Take by mouth.     estradiol (ESTRACE) 0.1 MG/GM vaginal cream PLACE 0.5 GRAMS VAGINALLY NIGHTLY FOR 2 WEEKS THEN TWICE A WEEK THEREAFTER 42.5 g 1   Ferrous Sulfate (IRON) 325 (65 Fe) MG TABS Take by mouth.     levothyroxine (SYNTHROID) 88 MCG tablet Take 1 tablet (88 mcg total) by mouth daily before breakfast. 90 tablet 3   Omega-3 Fatty Acids (FISH OIL) 1000 MG CAPS Take 1,000 mg by mouth.     vitamin B-12 (CYANOCOBALAMIN) 100 MCG tablet Take 100 mcg by mouth daily.     No current facility-administered medications on file prior to visit.    Review of Systems:  As per HPI- otherwise negative.   Physical Examination: Vitals:   04/28/21 1507  BP:  120/74  Pulse: 75  Resp: 18  Temp: (!) 97.5 F (36.4 C)  SpO2: 99%   Vitals:   04/28/21 1507  Weight: 136 lb 12.8 oz (62.1 kg)  Height: 5' 6.5" (1.689 m)   Body mass index is 21.75 kg/m. Ideal Body Weight: Weight in (lb) to have BMI = 25: 156.9  GEN: no acute distress.  Normal weight, looks well and younger than age HEENT: Atraumatic, Normocephalic.  Ears and Nose: No external deformity. CV: RRR, No M/G/R. No JVD. No thrill. No extra heart sounds. PULM: CTA B, no wheezes, crackles, rhonchi. No retractions. No resp. distress. No accessory muscle use. EXTR: No c/c/e PSYCH: Normally interactive. Conversant.  Left knee- crepitus is present, no heat or effusion, no redness.  Ligaments are stable  Assessment and Plan: Dysuria - Plan: Urine Culture, POCT urinalysis dipstick  Neck pain - Plan: meloxicam (MOBIC) 7.5 MG tablet  Chronic pain of left knee - Plan: Ambulatory referral to Orthopedic Surgery, meloxicam (MOBIC) 7.5 MG tablet  Age-related osteoporosis without current pathological fracture  Need for influenza vaccination - Plan: Flu Vaccine QUAD High Dose(Fluad)  Patient seen today for follow-up.  UA is benign, await urine culture She has noted knee pain, arthritis is likely given exam.  Refilled meloxicam which she has used previously, also suggested that she try Voltaren gel.  Referral made to orthopedics  Flu vaccine  We discussed osteoporosis, she will consider potentially starting bisphosphonate  Signed Abbe Amsterdam, MD  Addendum 11/18, received her preliminary urine culture as follows Called pt- will start on keflex for now, await final culture  Had to [facility]  Results for orders placed or performed in visit on 04/28/21  Urine Culture   Specimen: Urine  Result Value Ref Range   MICRO NUMBER: [id number]    SPECIMEN QUALITY: Adequate    Sample Source NOT GIVEN    STATUS: FINAL    ISOLATE 1: Klebsiella pneumoniae (A)       Susceptibility   Klebsiella  pneumoniae - URINE CULTURE, REFLEX    AMOX/CLAVULANIC <=2 Sensitive     AMPICILLIN 16 Resistant     AMPICILLIN/SULBACTAM 4 Sensitive     CEFAZOLIN* <=4 Not Reportable      * For infections other than uncomplicated UTI caused by E. coli, K. pneumoniae or P. mirabilis: Cefazolin is resistant if MIC > or = 8 mcg/mL. (Distinguishing susceptible versus intermediate for isolates with MIC < or = 4 mcg/mL requires additional testing.) For uncomplicated UTI caused by E. coli, K. pneumoniae or P. mirabilis: Cefazolin is susceptible if MIC <32 mcg/mL and predicts susceptible to the oral agents cefaclor, cefdinir, cefpodoxime, cefprozil, cefuroxime, cephalexin and loracarbef.  CEFTAZIDIME <=1 Sensitive     CEFEPIME <=1 Sensitive     CEFTRIAXONE <=1 Sensitive     CIPROFLOXACIN <=0.25 Sensitive     LEVOFLOXACIN <=0.12 Sensitive     GENTAMICIN <=1 Sensitive     IMIPENEM <=0.25 Sensitive     NITROFURANTOIN <=16 Sensitive     PIP/TAZO <=4 Sensitive     TOBRAMYCIN <=1 Sensitive     TRIMETH/SULFA* <=20 Sensitive      * For infections other than uncomplicated UTI caused by E. coli, K. pneumoniae or P. mirabilis: Cefazolin is resistant if MIC > or = 8 mcg/mL. (Distinguishing susceptible versus intermediate for isolates with MIC < or = 4 mcg/mL requires additional testing.) For uncomplicated UTI caused by E. coli, K. pneumoniae or P. mirabilis: Cefazolin is susceptible if MIC <32 mcg/mL and predicts susceptible to the oral agents cefaclor, cefdinir, cefpodoxime, cefprozil, cefuroxime, cephalexin and loracarbef. Legend: S = Susceptible  I = Intermediate R = Resistant  NS = Not susceptible * = Not tested  NR = Not reported **NN = See antimicrobic comments   POCT urinalysis dipstick  Result Value Ref Range   Color, UA yellow yellow   Clarity, UA clear clear   Glucose, UA negative negative mg/dL   Bilirubin, UA negative negative   Ketones, POC UA negative negative mg/dL   Spec Grav, UA  2.409 1.010 - 1.025   Blood, UA negative negative   pH, UA 6.0 5.0 - 8.0   Protein Ur, POC negative negative mg/dL   Urobilinogen, UA 0.2 0.2 or 1.0 E.U./dL   Nitrite, UA Negative Negative   Leukocytes, UA Negative Negative   Called pt 11/22 to make sure she got my message- she did, and is feeling better

## 2021-04-28 ENCOUNTER — Other Ambulatory Visit: Payer: Self-pay

## 2021-04-28 ENCOUNTER — Ambulatory Visit (INDEPENDENT_AMBULATORY_CARE_PROVIDER_SITE_OTHER): Payer: Medicare Other | Admitting: Family Medicine

## 2021-04-28 ENCOUNTER — Encounter: Payer: Self-pay | Admitting: Family Medicine

## 2021-04-28 VITALS — BP 120/74 | HR 75 | Temp 97.5°F | Resp 18 | Ht 66.5 in | Wt 136.8 lb

## 2021-04-28 DIAGNOSIS — M810 Age-related osteoporosis without current pathological fracture: Secondary | ICD-10-CM | POA: Diagnosis not present

## 2021-04-28 DIAGNOSIS — M25562 Pain in left knee: Secondary | ICD-10-CM

## 2021-04-28 DIAGNOSIS — R300 Dysuria: Secondary | ICD-10-CM

## 2021-04-28 DIAGNOSIS — G8929 Other chronic pain: Secondary | ICD-10-CM

## 2021-04-28 DIAGNOSIS — Z23 Encounter for immunization: Secondary | ICD-10-CM | POA: Diagnosis not present

## 2021-04-28 DIAGNOSIS — M542 Cervicalgia: Secondary | ICD-10-CM | POA: Diagnosis not present

## 2021-04-28 LAB — POCT URINALYSIS DIP (MANUAL ENTRY)
Bilirubin, UA: NEGATIVE
Blood, UA: NEGATIVE
Glucose, UA: NEGATIVE mg/dL
Ketones, POC UA: NEGATIVE mg/dL
Leukocytes, UA: NEGATIVE
Nitrite, UA: NEGATIVE
Protein Ur, POC: NEGATIVE mg/dL
Spec Grav, UA: 1.02 (ref 1.010–1.025)
Urobilinogen, UA: 0.2 E.U./dL
pH, UA: 6 (ref 5.0–8.0)

## 2021-04-28 MED ORDER — MELOXICAM 7.5 MG PO TABS: 7.5000 mg | ORAL_TABLET | Freq: Every day | ORAL | 0 refills | Status: DC

## 2021-04-30 LAB — URINE CULTURE
MICRO NUMBER:: 12645516
SPECIMEN QUALITY:: ADEQUATE

## 2021-04-30 MED ORDER — CEPHALEXIN 500 MG PO CAPS: 500.0000 mg | ORAL_CAPSULE | Freq: Two times a day (BID) | ORAL | 0 refills | Status: DC

## 2021-04-30 NOTE — Addendum Note (Signed)
Addended by: Abbe Amsterdam C on: 04/30/2021 12:19 PM   Modules accepted: Orders

## 2021-05-19 ENCOUNTER — Ambulatory Visit (INDEPENDENT_AMBULATORY_CARE_PROVIDER_SITE_OTHER): Payer: Medicare Other | Admitting: Orthopedic Surgery

## 2021-05-19 ENCOUNTER — Other Ambulatory Visit: Payer: Self-pay

## 2021-05-19 ENCOUNTER — Ambulatory Visit (INDEPENDENT_AMBULATORY_CARE_PROVIDER_SITE_OTHER): Payer: Medicare Other

## 2021-05-19 DIAGNOSIS — M79605 Pain in left leg: Secondary | ICD-10-CM | POA: Diagnosis not present

## 2021-05-22 ENCOUNTER — Encounter: Payer: Self-pay | Admitting: Orthopedic Surgery

## 2021-05-22 NOTE — Progress Notes (Signed)
Office Visit Note   Patient: Courtney Ayala           Date of Birth: 07-23-1940           MRN: [id number] Visit Date: 05/19/2021 Requested by: [name], MD [address] [place],  [place] [address] PCP: [name], MD  Subjective: Chief Complaint  Patient presents with   Left Leg - Pain    HPI: Courtney Ayala is a 80 year old patient with left knee and leg pain.  She states "I have arthritis".  Reports radicular type pain from the foot up to the buttock.  The pain wakes her from sleep at night.  Denies any locking weakness giving way popping but does report occasional stiffness.  She has had injections in the past into the left knee.  Beginning in the early fall the pain used to abate but now the pain has been relatively constant.  Does take Mobic for symptoms.  Monovisc in both knees helped 2 years ago.  She does the treadmill and the bike.  She does use a brace on the left-hand side.              ROS: All systems reviewed are negative as they relate to the chief complaint within the history of present illness.  Patient denies  fevers or chills.   Assessment & Plan: Visit Diagnoses:  1. Pain in left leg     Plan: Impression is bilateral knee arthritis and possible radiculopathy in left leg from facet arthritis and degenerative disc disease in the lumbar spine.  Recommendation is for restarting injection treatment particularly for that left knee.  We will get gel shots approved.  Patient would like to do that in early January.  May also need to consider spine work-up with possible ESI's if the radicular component of this persists or if her knee pain has not helped with the gel injection. This patient is diagnosed with osteoarthritis of the knee(s).    Radiographs show evidence of joint space narrowing, osteophytes, subchondral sclerosis and/or subchondral cysts.  This patient has knee pain which interferes with functional and activities of daily living.    This  patient has experienced inadequate response, adverse effects and/or intolerance with conservative treatments such as acetaminophen, NSAIDS, topical creams, physical therapy or regular exercise, knee bracing and/or weight loss.   This patient has experienced inadequate response or has a contraindication to intra articular steroid injections for at least 3 months.   This patient is not scheduled to have a total knee replacement within 6 months of starting treatment with viscosupplementation.   Follow-Up Instructions: No follow-ups on file.   Orders:  Orders Placed This Encounter  Procedures   XR Knee 1-2 Views Left   XR Lumbar Spine 2-3 Views   No orders of the defined types were placed in this encounter.     Procedures: No procedures performed   Clinical Data: No additional findings.  Objective: Vital Signs: There were no vitals taken for this visit.  Physical Exam:   Constitutional: Patient appears well-developed HEENT:  Head: Normocephalic Eyes:EOM are normal Neck: Normal range of motion Cardiovascular: Normal rate Pulmonary/chest: Effort normal Neurologic: Patient is alert Skin: Skin is warm Psychiatric: Patient has normal mood and affect   Ortho Exam: Ortho exam demonstrates full active and passive range of motion of the hips.  Left knee range of motion 0-80 right knee range of motion 0-100.  Extensor mechanism intact.  Collateral and cruciate  ligaments stable.  No effusion in either knee.  No masses lymphadenopathy or skin changes noted in the knee region.  Specialty Comments:  No specialty comments available.  Imaging: No results found.   PMFS History: Patient Active Problem List   Diagnosis Date Noted   Knee osteoarthritis 06/13/2017   Osteoporosis 06/12/2017   Pre-diabetes 08/27/2016   Postablative hypothyroidism 05/09/2016   Dyslipidemia 05/09/2016   Pelvic organ prolapse quantification stage 1 cystocele 03/30/2016   Past Medical History:   Diagnosis Date   Arthritis    B/L knees   Hyperlipidemia    Neuromuscular disorder ([facility])    polymyalgia   Osteoporosis 06/12/2017   Thyroid disease     Family History  Problem Relation Age of Onset   Hypertension Maternal Grandmother    Cancer Neg Hx    Stroke Neg Hx    Diabetes Neg Hx    Colon cancer Neg Hx    Breast cancer Neg Hx     Past Surgical History:  Procedure Laterality Date   cataract surgery     ECTOPIC PREGNANCY SURGERY  1972   Social History   Occupational History   Not on file  Tobacco Use   Smoking status: Never   Smokeless tobacco: Never  Vaping Use   Vaping Use: Never used  Substance and Sexual Activity   Alcohol use: No   Drug use: No   Sexual activity: Not Currently    Birth control/protection: Post-menopausal

## 2021-05-24 NOTE — Progress Notes (Signed)
Noted.  Will submit after 06/15/2021. 

## 2021-06-01 ENCOUNTER — Encounter (INDEPENDENT_AMBULATORY_CARE_PROVIDER_SITE_OTHER): Payer: Medicare Other | Admitting: Ophthalmology

## 2021-06-02 ENCOUNTER — Encounter (INDEPENDENT_AMBULATORY_CARE_PROVIDER_SITE_OTHER): Payer: Medicare Other | Admitting: Ophthalmology

## 2021-06-02 ENCOUNTER — Telehealth: Payer: Self-pay

## 2021-06-02 ENCOUNTER — Other Ambulatory Visit: Payer: Self-pay

## 2021-06-02 DIAGNOSIS — H59032 Cystoid macular edema following cataract surgery, left eye: Secondary | ICD-10-CM

## 2021-06-02 DIAGNOSIS — H353132 Nonexudative age-related macular degeneration, bilateral, intermediate dry stage: Secondary | ICD-10-CM

## 2021-06-02 DIAGNOSIS — H43813 Vitreous degeneration, bilateral: Secondary | ICD-10-CM

## 2021-06-02 NOTE — Telephone Encounter (Signed)
VOB submitted for Monovisc, bilateral knee. BV pending 

## 2021-06-16 ENCOUNTER — Telehealth: Payer: Self-pay

## 2021-06-16 NOTE — Telephone Encounter (Signed)
Approved for Monovisc, bilateral knee. Buy & Bill Secondary insurance will pick up remaining eligible expenses at 100%. Covers Medicare Part B deductible. No Co-pay No PA required  Appt. 06/17/2021

## 2021-06-17 ENCOUNTER — Encounter: Payer: Self-pay | Admitting: Orthopedic Surgery

## 2021-06-17 ENCOUNTER — Ambulatory Visit (INDEPENDENT_AMBULATORY_CARE_PROVIDER_SITE_OTHER): Payer: Medicare Other | Admitting: Surgical

## 2021-06-17 ENCOUNTER — Other Ambulatory Visit: Payer: Self-pay

## 2021-06-17 DIAGNOSIS — M1712 Unilateral primary osteoarthritis, left knee: Secondary | ICD-10-CM

## 2021-06-17 MED ORDER — HYALURONAN 88 MG/4ML IX SOSY
88.0000 mg | PREFILLED_SYRINGE | INTRA_ARTICULAR | Status: AC | PRN
  Administered 2021-06-17: 88 mg via INTRA_ARTICULAR

## 2021-06-17 MED ORDER — LIDOCAINE HCL 1 % IJ SOLN
5.0000 mL | INTRAMUSCULAR | Status: AC | PRN
  Administered 2021-06-17: 5 mL

## 2021-06-17 NOTE — Progress Notes (Signed)
° °  Procedure Note  Patient: Courtney Ayala             Date of Birth: [birth year]           MRN: [id number]             Visit Date: 06/17/2021  Procedures: Visit Diagnoses: No diagnosis found.  Large Joint Inj: L knee on 06/17/2021 4:13 PM Indications: pain, joint swelling and diagnostic evaluation Details: 18 G 1.5 in needle, superolateral approach  Arthrogram: No  Medications: 5 mL lidocaine 1 %; 88 mg Hyaluronan 88 MG/4ML Aspirate: 12 mL Outcome: tolerated well, no immediate complications  Patient opted to only have the left knee injected today as the right knee is actually doing pretty well with the use of topical Aspercreme.  Tolerated injection well.  Plan for her to call the office in 2 weeks to report whether or not she had improvement in her symptoms.  If no or little improvement, could consider further evaluation of radicular component from lumbar spine as a contributor to her knee pain.  She has noticed increasing low back pain over the last couple months. Procedure, treatment alternatives, risks and benefits explained, specific risks discussed. Consent was given by the patient. Immediately prior to procedure a time out was called to verify the correct patient, procedure, equipment, support staff and site/side marked as required. Patient was prepped and draped in the usual sterile fashion.

## 2021-07-02 ENCOUNTER — Telehealth: Payer: Self-pay | Admitting: Surgical

## 2021-07-02 NOTE — Telephone Encounter (Signed)
Pt called. Knee is doing better. She said she can sleep more at night but still hurts while walking.   CB [id number]

## 2021-07-02 NOTE — Telephone Encounter (Signed)
Thanks for the update.  If her symptoms are liveable then she can just keep on going as is, but if she is having some significant discomfort and would like to be re-evaluated, then she can make appointment. Overall, sounds like she is better so really up to her.

## 2021-07-07 ENCOUNTER — Other Ambulatory Visit: Payer: Self-pay

## 2021-07-07 ENCOUNTER — Encounter (INDEPENDENT_AMBULATORY_CARE_PROVIDER_SITE_OTHER): Payer: Medicare Other | Admitting: Ophthalmology

## 2021-07-07 DIAGNOSIS — H353132 Nonexudative age-related macular degeneration, bilateral, intermediate dry stage: Secondary | ICD-10-CM | POA: Diagnosis not present

## 2021-07-07 DIAGNOSIS — H35342 Macular cyst, hole, or pseudohole, left eye: Secondary | ICD-10-CM

## 2021-07-07 DIAGNOSIS — H43813 Vitreous degeneration, bilateral: Secondary | ICD-10-CM | POA: Diagnosis not present

## 2021-07-07 DIAGNOSIS — H59032 Cystoid macular edema following cataract surgery, left eye: Secondary | ICD-10-CM | POA: Diagnosis not present

## 2021-07-25 NOTE — Patient Instructions (Addendum)
It was great to see you again today, I will be in touch with your labs.  Assuming all is well please see me in about 6 months  If not done already, consider getting the shingles vaccine/Shingrix at your pharmacy I would also suggest getting a COVID-19 booster- Bivalent   Ok to try the Voltaren gel for your knee  Please ask Dr August Saucer if he thinks surgery would benefit you for your knee Also could help to see Dr Kathi Ludwig about your joint pains

## 2021-07-25 NOTE — Progress Notes (Addendum)
[place] at [facility] [address] [place], [place] [address] [phone] [phone] [phone]  Date:  07/28/2021   Name:  Courtney Ayala   DOB:  1941-03-09   MRN:  [id number]  PCP:  Darreld Mclean, MD    Chief Complaint: 6 month follow up (Concerns/ questions: 1. Pt has had a joint injection in the left leg since last OV. It did help some but now she has pain again. 2. She says she has a hx of Polymyalgia and wonders if she can have this a 2nd time. /MA due/Zoster- none in ncir)   History of Present Illness:  Courtney Ayala is a 81 y.o. very pleasant female patient who presents with the following:  Patient seen today for periodic follow-up- history of arthritis, osteoporosis, hypothyroidism, dyslipidemia, prediabetes, B12 deficiency   Most recent visit with myself was in November for UTI symptoms and knee pain  Suggest Shingrix Most recent lab work done in [employer] today  Lipitor 10 Levothyroxine 88 Vitamin B12  She had a left knee injection on 1/5- she notes it helped for a few weeks but now the pain is coming back some. Her injection was per Alphonzo Severance.  They have not talked about surgery for her as of yet - she wonders about trying voltaren gel, advised this would be ok   Pt notes she had dx of polymyalgia about 10 years ago - she notes she was treated with medication- she thinks it was prednisone- when this happened in the past.  She notes she was treated for nearly a year- she is not quite sure what she was taking. It sounds like she as seeing Dr Dossie Der who is a rheumatologist.   She states she is able to get in to see them again without a referral    Patient Active Problem List   Diagnosis Date Noted   Knee osteoarthritis 06/13/2017   Osteoporosis 06/12/2017   Pre-diabetes 08/27/2016   Postablative hypothyroidism 05/09/2016   Dyslipidemia 05/09/2016   Pelvic organ prolapse quantification stage 1 cystocele 03/30/2016    Past Medical  History:  Diagnosis Date   Arthritis    B/L knees   Hyperlipidemia    Neuromuscular disorder ([facility])    polymyalgia   Osteoporosis 06/12/2017   Thyroid disease     Past Surgical History:  Procedure Laterality Date   cataract surgery     ECTOPIC PREGNANCY SURGERY  1972    Social History   Tobacco Use   Smoking status: Never   Smokeless tobacco: Never  Vaping Use   Vaping Use: Never used  Substance Use Topics   Alcohol use: No   Drug use: No    Family History  Problem Relation Age of Onset   Hypertension Maternal Grandmother    Cancer Neg Hx    Stroke Neg Hx    Diabetes Neg Hx    Colon cancer Neg Hx    Breast cancer Neg Hx     No Known Allergies  Medication list has been reviewed and updated.  Current Outpatient Medications on File Prior to Visit  Medication Sig Dispense Refill   atorvastatin (LIPITOR) 10 MG tablet TAKE 1 TABLET DAILY 90 tablet 1   Bromfenac Sodium (PROLENSA) 0.07 % SOLN Apply to eye in the morning and at bedtime.     Calcium Carbonate (CALCIUM 600 PO) Take 600 mg by mouth.     CRANBERRY PO Take by mouth.     estradiol (  ESTRACE) 0.1 MG/GM vaginal cream PLACE 0.5 GRAMS VAGINALLY NIGHTLY FOR 2 WEEKS THEN TWICE A WEEK THEREAFTER 42.5 g 1   Ferrous Sulfate (IRON) 325 (65 Fe) MG TABS Take by mouth.     levothyroxine (SYNTHROID) 88 MCG tablet Take 1 tablet (88 mcg total) by mouth daily before breakfast. 90 tablet 3   Omega-3 Fatty Acids (FISH OIL) 1000 MG CAPS Take 1,000 mg by mouth.     vitamin B-12 (CYANOCOBALAMIN) 100 MCG tablet Take 100 mcg by mouth daily.     No current facility-administered medications on file prior to visit.    Review of Systems:  As per HPI- otherwise negative.    Physical Examination: Vitals:   07/28/21 0914  BP: 124/80  Pulse: (!) 58  Resp: 18  Temp: 97.7 F (36.5 C)  SpO2: 97%   Vitals:   07/28/21 0914  Weight: 133 lb 6.4 oz (60.5 kg)  Height: 5\' 8"  (1.727 m)   Body mass index is 20.28 kg/m. Ideal Body  Weight: Weight in (lb) to have BMI = 25: 164.1  GEN: no acute distress. Normal weight, looks well and younger than age  20: Atraumatic, Normocephalic.  Bilateral TM wnl, oropharynx normal.  PEERL,EOMI.   Ears and Nose: No external deformity. CV: RRR, No M/G/R. No JVD. No thrill. No extra heart sounds. PULM: CTA B, no wheezes, crackles, rhonchi. No retractions. No resp. distress. No accessory muscle use. ABD: S, NT, ND. No rebound. No HSM. EXTR: No c/c/e PSYCH: Normally interactive. Conversant.    Assessment and Plan: Pre-diabetes - Plan: Comprehensive metabolic panel, Hemoglobin A1c  Postablative hypothyroidism - Plan: TSH  Medication monitoring encounter - Plan: CBC  Vitamin B12 deficiency - Plan: Vitamin B12  Multiple joint pain - Plan: Sedimentation rate, C-reactive protein  History of polymyalgia rheumatica - Plan: Sedimentation rate, C-reactive protein  Acute pain of left knee  Seen today for follow-up  Will plan further follow- up pending labs. History of polymyalgia- will check CRP and sed rate today Discussed her left knee pain- ?would surgery help her Encouraged her to discuss with Dr Marlou Sa and ok to try voltaren    Signed Lamar Blinks, MD  Received her labs as below, message to patient  Results for orders placed or performed in visit on 07/28/21  CBC  Result Value Ref Range   WBC 3.5 (L) 4.0 - 10.5 K/uL   RBC 4.13 3.87 - 5.11 Mil/uL   Platelets 126.0 (L) 150.0 - 400.0 K/uL   Hemoglobin 13.1 12.0 - 15.0 g/dL   HCT 40.7 36.0 - 46.0 %   MCV 98.6 78.0 - 100.0 fl   MCHC 32.2 30.0 - 36.0 g/dL   RDW 13.8 11.5 - 15.5 %  Comprehensive metabolic panel  Result Value Ref Range   Sodium 141 135 - 145 mEq/L   Potassium 3.8 3.5 - 5.1 mEq/L   Chloride 105 96 - 112 mEq/L   CO2 30 19 - 32 mEq/L   Glucose, Bld 98 70 - 99 mg/dL   BUN 21 6 - 23 mg/dL   Creatinine, Ser 0.84 0.40 - 1.20 mg/dL   Total Bilirubin 0.7 0.2 - 1.2 mg/dL   Alkaline Phosphatase 59 39 - 117  U/L   AST 15 0 - 37 U/L   ALT 9 0 - 35 U/L   Total Protein 7.3 6.0 - 8.3 g/dL   Albumin 4.3 3.5 - 5.2 g/dL   GFR 64.77 >60.00 mL/min   Calcium 9.5 8.4 - 10.5 mg/dL  Hemoglobin A1c  Result Value Ref Range   Hgb A1c MFr Bld 5.8 4.6 - 6.5 %  Vitamin B12  Result Value Ref Range   Vitamin B-12 212 211 - 911 pg/mL  TSH  Result Value Ref Range   TSH 0.59 0.35 - 5.50 uIU/mL  Sedimentation rate  Result Value Ref Range   Sed Rate 8 0 - 30 mm/hr  C-reactive protein  Result Value Ref Range   CRP <1.0 0.5 - 20.0 mg/dL

## 2021-07-28 ENCOUNTER — Encounter: Payer: Self-pay | Admitting: Family Medicine

## 2021-07-28 ENCOUNTER — Ambulatory Visit (INDEPENDENT_AMBULATORY_CARE_PROVIDER_SITE_OTHER): Payer: Medicare Other | Admitting: Family Medicine

## 2021-07-28 VITALS — BP 124/80 | HR 58 | Temp 97.7°F | Resp 18 | Ht 68.0 in | Wt 133.4 lb

## 2021-07-28 DIAGNOSIS — R7303 Prediabetes: Secondary | ICD-10-CM | POA: Diagnosis not present

## 2021-07-28 DIAGNOSIS — M2550 Pain in unspecified joint: Secondary | ICD-10-CM

## 2021-07-28 DIAGNOSIS — E538 Deficiency of other specified B group vitamins: Secondary | ICD-10-CM

## 2021-07-28 DIAGNOSIS — Z8739 Personal history of other diseases of the musculoskeletal system and connective tissue: Secondary | ICD-10-CM

## 2021-07-28 DIAGNOSIS — Z5181 Encounter for therapeutic drug level monitoring: Secondary | ICD-10-CM | POA: Diagnosis not present

## 2021-07-28 DIAGNOSIS — E890 Postprocedural hypothyroidism: Secondary | ICD-10-CM

## 2021-07-28 DIAGNOSIS — D696 Thrombocytopenia, unspecified: Secondary | ICD-10-CM

## 2021-07-28 DIAGNOSIS — M25562 Pain in left knee: Secondary | ICD-10-CM

## 2021-07-28 LAB — COMPREHENSIVE METABOLIC PANEL
ALT: 9 U/L (ref 0–35)
AST: 15 U/L (ref 0–37)
Albumin: 4.3 g/dL (ref 3.5–5.2)
Alkaline Phosphatase: 59 U/L (ref 39–117)
BUN: 21 mg/dL (ref 6–23)
CO2: 30 mEq/L (ref 19–32)
Calcium: 9.5 mg/dL (ref 8.4–10.5)
Chloride: 105 mEq/L (ref 96–112)
Creatinine, Ser: 0.84 mg/dL (ref 0.40–1.20)
GFR: 64.77 mL/min (ref >60.00)
Glucose, Bld: 98 mg/dL (ref 70–99)
Potassium: 3.8 mEq/L (ref 3.5–5.1)
Sodium: 141 mEq/L (ref 135–145)
Total Bilirubin: 0.7 mg/dL (ref 0.2–1.2)
Total Protein: 7.3 g/dL (ref 6.0–8.3)

## 2021-07-28 LAB — CBC
HCT: 40.7 % (ref 36.0–46.0)
Hemoglobin: 13.1 g/dL (ref 12.0–15.0)
MCHC: 32.2 g/dL (ref 30.0–36.0)
MCV: 98.6 fl (ref 78.0–100.0)
Platelets: 126 10*3/uL — ABNORMAL LOW (ref 150.0–400.0)
RBC: 4.13 Mil/uL (ref 3.87–5.11)
RDW: 13.8 % (ref 11.5–15.5)
WBC: 3.5 10*3/uL — ABNORMAL LOW (ref 4.0–10.5)

## 2021-07-28 LAB — TSH: TSH: 0.59 u[IU]/mL (ref 0.35–5.50)

## 2021-07-28 LAB — VITAMIN B12: Vitamin B-12: 212 pg/mL (ref 211–911)

## 2021-07-28 LAB — HEMOGLOBIN A1C: Hgb A1c MFr Bld: 5.8 % (ref 4.6–6.5)

## 2021-07-28 LAB — SEDIMENTATION RATE: Sed Rate: 8 mm/hr (ref 0–30)

## 2021-07-28 LAB — C-REACTIVE PROTEIN: CRP: <1 mg/dL (ref 0.5–20.0)

## 2021-07-28 NOTE — Addendum Note (Signed)
Addended by: Abbe Amsterdam C on: 07/28/2021 07:34 PM   Modules accepted: Orders

## 2021-08-11 ENCOUNTER — Encounter: Payer: Self-pay | Admitting: Family Medicine

## 2021-08-11 ENCOUNTER — Other Ambulatory Visit (INDEPENDENT_AMBULATORY_CARE_PROVIDER_SITE_OTHER): Payer: Medicare Other

## 2021-08-11 DIAGNOSIS — D696 Thrombocytopenia, unspecified: Secondary | ICD-10-CM | POA: Diagnosis not present

## 2021-08-11 LAB — CBC
HCT: 39.7 % (ref 36.0–46.0)
Hemoglobin: 12.9 g/dL (ref 12.0–15.0)
MCHC: 32.6 g/dL (ref 30.0–36.0)
MCV: 98.6 fl (ref 78.0–100.0)
Platelets: 140 10*3/uL — ABNORMAL LOW (ref 150.0–400.0)
RBC: 4.02 Mil/uL (ref 3.87–5.11)
RDW: 13.4 % (ref 11.5–15.5)
WBC: 4.1 10*3/uL (ref 4.0–10.5)

## 2021-08-18 ENCOUNTER — Encounter (INDEPENDENT_AMBULATORY_CARE_PROVIDER_SITE_OTHER): Payer: Medicare Other | Admitting: Ophthalmology

## 2021-08-20 ENCOUNTER — Encounter (INDEPENDENT_AMBULATORY_CARE_PROVIDER_SITE_OTHER): Payer: Medicare Other | Admitting: Ophthalmology

## 2021-08-20 ENCOUNTER — Other Ambulatory Visit: Payer: Self-pay

## 2021-08-20 DIAGNOSIS — H59032 Cystoid macular edema following cataract surgery, left eye: Secondary | ICD-10-CM

## 2021-08-20 DIAGNOSIS — H353132 Nonexudative age-related macular degeneration, bilateral, intermediate dry stage: Secondary | ICD-10-CM | POA: Diagnosis not present

## 2021-08-20 DIAGNOSIS — H43813 Vitreous degeneration, bilateral: Secondary | ICD-10-CM | POA: Diagnosis not present

## 2021-09-07 ENCOUNTER — Other Ambulatory Visit: Payer: Self-pay | Admitting: Family Medicine

## 2021-09-07 DIAGNOSIS — E785 Hyperlipidemia, unspecified: Secondary | ICD-10-CM

## 2021-10-01 ENCOUNTER — Encounter (INDEPENDENT_AMBULATORY_CARE_PROVIDER_SITE_OTHER): Payer: Medicare Other | Admitting: Ophthalmology

## 2021-10-01 DIAGNOSIS — H59032 Cystoid macular edema following cataract surgery, left eye: Secondary | ICD-10-CM

## 2021-10-01 DIAGNOSIS — H353132 Nonexudative age-related macular degeneration, bilateral, intermediate dry stage: Secondary | ICD-10-CM | POA: Diagnosis not present

## 2021-10-01 DIAGNOSIS — H43813 Vitreous degeneration, bilateral: Secondary | ICD-10-CM | POA: Diagnosis not present

## 2021-10-11 ENCOUNTER — Telehealth: Payer: Self-pay | Admitting: Family Medicine

## 2021-10-11 NOTE — Telephone Encounter (Signed)
Pt thinks she overdue for a urine protein test. Please advise pt if she needs to come sooner than her appt in August.  ?

## 2021-10-11 NOTE — Telephone Encounter (Signed)
Pt has Dx of Pre-DM, does she need this? If so, is it okay to wait until August? ?

## 2021-10-12 NOTE — Telephone Encounter (Signed)
Message sent through MyChart since call kept dropping.  ?

## 2021-11-12 ENCOUNTER — Encounter (INDEPENDENT_AMBULATORY_CARE_PROVIDER_SITE_OTHER): Payer: Medicare Other | Admitting: Ophthalmology

## 2021-11-12 DIAGNOSIS — H43813 Vitreous degeneration, bilateral: Secondary | ICD-10-CM

## 2021-11-12 DIAGNOSIS — H353132 Nonexudative age-related macular degeneration, bilateral, intermediate dry stage: Secondary | ICD-10-CM | POA: Diagnosis not present

## 2021-11-12 DIAGNOSIS — H59032 Cystoid macular edema following cataract surgery, left eye: Secondary | ICD-10-CM

## 2021-12-24 ENCOUNTER — Encounter (INDEPENDENT_AMBULATORY_CARE_PROVIDER_SITE_OTHER): Payer: Medicare Other | Admitting: Ophthalmology

## 2021-12-24 DIAGNOSIS — H59032 Cystoid macular edema following cataract surgery, left eye: Secondary | ICD-10-CM | POA: Diagnosis not present

## 2021-12-24 DIAGNOSIS — H353132 Nonexudative age-related macular degeneration, bilateral, intermediate dry stage: Secondary | ICD-10-CM

## 2021-12-24 DIAGNOSIS — H43813 Vitreous degeneration, bilateral: Secondary | ICD-10-CM | POA: Diagnosis not present

## 2022-01-24 NOTE — Progress Notes (Unsigned)
[facility] at [facility] [address], [address] [place], [place] [address] [phone]  Date:  01/26/2022   Name:  Courtney Ayala   DOB:  Nov 09, 1940   MRN:  [id number]  PCP:  [name], MD    Chief Complaint: No chief complaint on file.   History of Present Illness:  Courtney Ayala is a 81 y.o. very pleasant female patient who presents with the following:  Patient seen today for follow-up/Medicare physical Most recent visit with myself was in February-  history of arthritis, osteoporosis, hypothyroidism, dyslipidemia, prediabetes, B12 deficiency   Shingrix Recommend COVID booster and flu shot this fall  Lipitor 10 Estrogen vaginal cream Levothyroxine 88 B12, fish oil, calcium  Complete labs done in February, we also did a recheck CBC in March to follow-up on slightly low white count and platelet count  Patient Active Problem List   Diagnosis Date Noted   Knee osteoarthritis 06/13/2017   Osteoporosis 06/12/2017   Pre-diabetes 08/27/2016   Postablative hypothyroidism 05/09/2016   Dyslipidemia 05/09/2016   Pelvic organ prolapse quantification stage 1 cystocele 03/30/2016    Past Medical History:  Diagnosis Date   Arthritis    B/L knees   Hyperlipidemia    Neuromuscular disorder ([facility])    polymyalgia   Osteoporosis 06/12/2017   Thyroid disease     Past Surgical History:  Procedure Laterality Date   cataract surgery     ECTOPIC PREGNANCY SURGERY  1972    Social History   Tobacco Use   Smoking status: Never   Smokeless tobacco: Never  Vaping Use   Vaping Use: Never used  Substance Use Topics   Alcohol use: No   Drug use: No    Family History  Problem Relation Age of Onset   Hypertension Maternal Grandmother    Cancer Neg Hx    Stroke Neg Hx    Diabetes Neg Hx    Colon cancer Neg Hx    Breast cancer Neg Hx     No Known Allergies  Medication list has been reviewed and updated.  Current Outpatient  Medications on File Prior to Visit  Medication Sig Dispense Refill   atorvastatin (LIPITOR) 10 MG tablet TAKE 1 TABLET DAILY 90 tablet 3   Bromfenac Sodium (PROLENSA) 0.07 % SOLN Apply to eye in the morning and at bedtime.     Calcium Carbonate (CALCIUM 600 PO) Take 600 mg by mouth.     CRANBERRY PO Take by mouth.     estradiol (ESTRACE) 0.1 MG/GM vaginal cream PLACE 0.5 GRAMS VAGINALLY NIGHTLY FOR 2 WEEKS THEN TWICE A WEEK THEREAFTER 42.5 g 1   Ferrous Sulfate (IRON) 325 (65 Fe) MG TABS Take by mouth.     levothyroxine (SYNTHROID) 88 MCG tablet Take 1 tablet (88 mcg total) by mouth daily before breakfast. 90 tablet 3   Omega-3 Fatty Acids (FISH OIL) 1000 MG CAPS Take 1,000 mg by mouth.     vitamin B-12 (CYANOCOBALAMIN) 100 MCG tablet Take 100 mcg by mouth daily.     No current facility-administered medications on file prior to visit.    Review of Systems:  As per HPI- otherwise negative.   Physical Examination: There were no vitals filed for this visit. There were no vitals filed for this visit. There is no height or weight on file to calculate BMI. Ideal Body Weight:    GEN: no acute distress. HEENT: Atraumatic, Normocephalic.  Ears and Nose: No external deformity.  CV: RRR, No M/G/R. No JVD. No thrill. No extra heart sounds. PULM: CTA B, no wheezes, crackles, rhonchi. No retractions. No resp. distress. No accessory muscle use. ABD: S, NT, ND, +BS. No rebound. No HSM. EXTR: No c/c/e PSYCH: Normally interactive. Conversant.    Assessment and Plan: *** Medicare well visit.  Encouraged healthy diet and exercise routine Signed Abbe Amsterdam, MD

## 2022-01-24 NOTE — Patient Instructions (Incomplete)
It was great to see you again today, I will be in touch with your labs Assuming all is well please see me in about 6 months I would suggest getting a COVID-19 booster and flu shot this fall  Let me know if constipation symptoms are changing or getting worse Can go back on vaginal estrogen cream

## 2022-01-26 ENCOUNTER — Encounter: Payer: Self-pay | Admitting: Family Medicine

## 2022-01-26 ENCOUNTER — Ambulatory Visit (INDEPENDENT_AMBULATORY_CARE_PROVIDER_SITE_OTHER): Payer: Medicare Other | Admitting: Family Medicine

## 2022-01-26 ENCOUNTER — Telehealth: Payer: Self-pay | Admitting: *Deleted

## 2022-01-26 VITALS — BP 152/69 | HR 56 | Temp 98.0°F | Resp 16 | Ht 67.0 in | Wt 131.0 lb

## 2022-01-26 DIAGNOSIS — E785 Hyperlipidemia, unspecified: Secondary | ICD-10-CM

## 2022-01-26 DIAGNOSIS — R079 Chest pain, unspecified: Secondary | ICD-10-CM | POA: Diagnosis not present

## 2022-01-26 DIAGNOSIS — D696 Thrombocytopenia, unspecified: Secondary | ICD-10-CM | POA: Diagnosis not present

## 2022-01-26 DIAGNOSIS — E890 Postprocedural hypothyroidism: Secondary | ICD-10-CM

## 2022-01-26 DIAGNOSIS — R3915 Urgency of urination: Secondary | ICD-10-CM

## 2022-01-26 DIAGNOSIS — R7303 Prediabetes: Secondary | ICD-10-CM

## 2022-01-26 DIAGNOSIS — N3000 Acute cystitis without hematuria: Secondary | ICD-10-CM

## 2022-01-26 DIAGNOSIS — R001 Bradycardia, unspecified: Secondary | ICD-10-CM

## 2022-01-26 LAB — LIPID PANEL
Cholesterol: 246 mg/dL — ABNORMAL HIGH (ref 0–200)
HDL: 103.3 mg/dL (ref >39.00)
LDL Cholesterol: 133 mg/dL — ABNORMAL HIGH (ref 0–99)
NonHDL: 142.89
Total CHOL/HDL Ratio: 2
Triglycerides: 49 mg/dL (ref 0.0–149.0)
VLDL: 9.8 mg/dL (ref 0.0–40.0)

## 2022-01-26 LAB — BASIC METABOLIC PANEL
BUN: 18 mg/dL (ref 6–23)
CO2: 28 mEq/L (ref 19–32)
Calcium: 9.2 mg/dL (ref 8.4–10.5)
Chloride: 104 mEq/L (ref 96–112)
Creatinine, Ser: 0.83 mg/dL (ref 0.40–1.20)
GFR: 65.48 mL/min (ref >60.00)
Glucose, Bld: 90 mg/dL (ref 70–99)
Potassium: 3.6 mEq/L (ref 3.5–5.1)
Sodium: 140 mEq/L (ref 135–145)

## 2022-01-26 LAB — TSH: TSH: 0.71 u[IU]/mL (ref 0.35–5.50)

## 2022-01-26 LAB — CBC
HCT: 39.4 % (ref 36.0–46.0)
Hemoglobin: 13 g/dL (ref 12.0–15.0)
MCHC: 32.9 g/dL (ref 30.0–36.0)
MCV: 99 fl (ref 78.0–100.0)
Platelets: 149 10*3/uL — ABNORMAL LOW (ref 150.0–400.0)
RBC: 3.98 Mil/uL (ref 3.87–5.11)
RDW: 13.5 % (ref 11.5–15.5)
WBC: 3.1 10*3/uL — ABNORMAL LOW (ref 4.0–10.5)

## 2022-01-26 LAB — HEMOGLOBIN A1C: Hgb A1c MFr Bld: 6.1 % (ref 4.6–6.5)

## 2022-01-26 MED ORDER — ESTRADIOL 0.1 MG/GM VA CREA: TOPICAL_CREAM | VAGINAL | 2 refills | Status: DC

## 2022-01-26 NOTE — Telephone Encounter (Signed)
Please call Charell Faulk in monitor department at [facility] [phone], to schedule an appointment to have your ZIO XT patch monitor applied.

## 2022-01-27 NOTE — Telephone Encounter (Signed)
Patient returned call to schedule appointment to apply ZIO patch.  Patient stated she would like to do this this afternoon or tomorrow.

## 2022-01-28 ENCOUNTER — Encounter (INDEPENDENT_AMBULATORY_CARE_PROVIDER_SITE_OTHER): Payer: Medicare Other | Admitting: Ophthalmology

## 2022-01-28 DIAGNOSIS — H43813 Vitreous degeneration, bilateral: Secondary | ICD-10-CM

## 2022-01-28 DIAGNOSIS — H59032 Cystoid macular edema following cataract surgery, left eye: Secondary | ICD-10-CM | POA: Diagnosis not present

## 2022-01-28 DIAGNOSIS — H353132 Nonexudative age-related macular degeneration, bilateral, intermediate dry stage: Secondary | ICD-10-CM

## 2022-02-02 ENCOUNTER — Ambulatory Visit (INDEPENDENT_AMBULATORY_CARE_PROVIDER_SITE_OTHER): Payer: Medicare Other

## 2022-02-02 DIAGNOSIS — R001 Bradycardia, unspecified: Secondary | ICD-10-CM

## 2022-02-02 NOTE — Progress Notes (Unsigned)
ZIO XT serial # [id number] from office inventory applied to patient.

## 2022-02-18 ENCOUNTER — Encounter: Payer: Self-pay | Admitting: Family Medicine

## 2022-03-04 ENCOUNTER — Encounter (INDEPENDENT_AMBULATORY_CARE_PROVIDER_SITE_OTHER): Payer: Medicare Other | Admitting: Ophthalmology

## 2022-03-04 DIAGNOSIS — H353132 Nonexudative age-related macular degeneration, bilateral, intermediate dry stage: Secondary | ICD-10-CM | POA: Diagnosis not present

## 2022-03-04 DIAGNOSIS — H59032 Cystoid macular edema following cataract surgery, left eye: Secondary | ICD-10-CM | POA: Diagnosis not present

## 2022-03-04 DIAGNOSIS — H43813 Vitreous degeneration, bilateral: Secondary | ICD-10-CM

## 2022-03-09 ENCOUNTER — Telehealth: Payer: Self-pay | Admitting: Family Medicine

## 2022-03-09 ENCOUNTER — Other Ambulatory Visit: Payer: Self-pay

## 2022-03-09 DIAGNOSIS — E890 Postprocedural hypothyroidism: Secondary | ICD-10-CM

## 2022-03-09 MED ORDER — LEVOTHYROXINE SODIUM 88 MCG PO TABS: 88.0000 ug | ORAL_TABLET | Freq: Every day | ORAL | 3 refills | Status: DC

## 2022-03-09 NOTE — Telephone Encounter (Signed)
Patient came in office and requested refill  DUK:[id number] (SYNTHROID) 88 MCG tablet [[id number]]   Pharm: [facility]

## 2022-03-09 NOTE — Telephone Encounter (Signed)
Refill sent.

## 2022-04-07 ENCOUNTER — Encounter (INDEPENDENT_AMBULATORY_CARE_PROVIDER_SITE_OTHER): Payer: Medicare Other | Admitting: Ophthalmology

## 2022-04-07 DIAGNOSIS — H59032 Cystoid macular edema following cataract surgery, left eye: Secondary | ICD-10-CM

## 2022-04-07 DIAGNOSIS — H353132 Nonexudative age-related macular degeneration, bilateral, intermediate dry stage: Secondary | ICD-10-CM | POA: Diagnosis not present

## 2022-04-07 DIAGNOSIS — H43813 Vitreous degeneration, bilateral: Secondary | ICD-10-CM | POA: Diagnosis not present

## 2022-04-08 ENCOUNTER — Encounter (INDEPENDENT_AMBULATORY_CARE_PROVIDER_SITE_OTHER): Payer: Medicare Other | Admitting: Ophthalmology

## 2022-05-12 ENCOUNTER — Encounter (INDEPENDENT_AMBULATORY_CARE_PROVIDER_SITE_OTHER): Payer: Medicare Other | Admitting: Ophthalmology

## 2022-05-12 DIAGNOSIS — H59032 Cystoid macular edema following cataract surgery, left eye: Secondary | ICD-10-CM | POA: Diagnosis not present

## 2022-05-12 DIAGNOSIS — H353132 Nonexudative age-related macular degeneration, bilateral, intermediate dry stage: Secondary | ICD-10-CM

## 2022-05-12 DIAGNOSIS — H43813 Vitreous degeneration, bilateral: Secondary | ICD-10-CM | POA: Diagnosis not present

## 2022-06-04 ENCOUNTER — Other Ambulatory Visit: Payer: Self-pay

## 2022-06-04 ENCOUNTER — Emergency Department (HOSPITAL_BASED_OUTPATIENT_CLINIC_OR_DEPARTMENT_OTHER)
Admission: EM | Admit: 2022-06-04 | Discharge: 2022-06-04 | Disposition: A | Discharge status: Home / Self Care | Payer: Medicare Other | Attending: Emergency Medicine | Admitting: Emergency Medicine

## 2022-06-04 DIAGNOSIS — U071 COVID-19: Secondary | ICD-10-CM | POA: Insufficient documentation

## 2022-06-04 DIAGNOSIS — R059 Cough, unspecified: Secondary | ICD-10-CM | POA: Diagnosis present

## 2022-06-04 LAB — RESP PANEL BY RT-PCR (RSV, FLU A&B, COVID)  RVPGX2
Influenza A by PCR: NEGATIVE
Influenza B by PCR: NEGATIVE
Resp Syncytial Virus by PCR: NEGATIVE
SARS Coronavirus 2 by RT PCR: POSITIVE — AB

## 2022-06-04 MED ORDER — NIRMATRELVIR/RITONAVIR (PAXLOVID)TABLET: 3.0000 | ORAL_TABLET | Freq: Two times a day (BID) | ORAL | 0 refills | Status: AC

## 2022-06-04 NOTE — ED Provider Notes (Signed)
[facility] EMERGENCY DEPARTMENT Provider Note   CSN: [id number] Arrival date & time: 06/04/22  0865     History  Chief Complaint  Patient presents with   Cough    Courtney Ayala is a 81 y.o. female.  Patient presents to the emergency department complaining of cough and headache which began this morning.  Patient states that her husband had COVID 2 weeks ago and she is here for testing.  She is in no acute distress.  Patient denies fevers, nausea, vomiting, chest pain, shortness of breath, urinary symptoms.  Past medical history significant for thyroid disease, arthritis, hyperlipidemia, prediabetes  HPI     Home Medications Prior to Admission medications   Medication Sig Start Date End Date Taking? Authorizing Provider  nirmatrelvir/ritonavir (PAXLOVID) 20 x 150 MG & 10 x 100MG  TABS Take 3 tablets by mouth 2 (two) times daily for 5 days. Patient GFR is 65. Take nirmatrelvir (150 mg) two tablets twice daily for 5 days and ritonavir (100 mg) one tablet twice daily for 5 days. 06/04/22 06/09/22 Yes 06/11/22 B, PA-C  atorvastatin (LIPITOR) 10 MG tablet TAKE 1 TABLET DAILY 09/07/21   Copland, 09/09/21, MD  Bromfenac Sodium (PROLENSA) 0.07 % SOLN Apply to eye in the morning and at bedtime.    [provider]  Calcium Carbonate (CALCIUM 600 PO) Take 600 mg by mouth.    [provider]  CRANBERRY PO Take by mouth.    [provider]  estradiol (ESTRACE) 0.1 MG/GM vaginal cream PLACE 0.5 GRAMS VAGINALLY TWICE WEEKLY 01/26/22   Copland, 01/28/22, MD  Ferrous Sulfate (IRON) 325 (65 Fe) MG TABS Take by mouth.    [provider]  levothyroxine (SYNTHROID) 88 MCG tablet Take 1 tablet (88 mcg total) by mouth daily before breakfast. 03/09/22   Copland, 03/11/22, MD  Omega-3 Fatty Acids (FISH OIL) 1000 MG CAPS Take 1,000 mg by mouth.    [provider]  vitamin B-12 (CYANOCOBALAMIN) 100 MCG tablet Take 100 mcg by mouth daily.    [provider]      Allergies    Patient has no known allergies.    Review of Systems   Review of Systems  Respiratory:  Positive for cough.   Neurological:  Positive for headaches.    Physical Exam Updated Vital Signs BP (!) 156/83   Pulse 62   Temp 97.9 F (36.6 C) (Oral)   Resp 17   SpO2 100%  Physical Exam Vitals and nursing note reviewed.  Constitutional:      General: She is not in acute distress.    Appearance: She is well-developed.  HENT:     Head: Normocephalic and atraumatic.  Eyes:     Conjunctiva/sclera: Conjunctivae normal.  Cardiovascular:     Rate and Rhythm: Normal rate and regular rhythm.     Heart sounds: No murmur heard. Pulmonary:     Effort: Pulmonary effort is normal. No respiratory distress.     Breath sounds: Normal breath sounds.  Abdominal:     Palpations: Abdomen is soft.     Tenderness: There is no abdominal tenderness.  Musculoskeletal:        General: No swelling.     Cervical back: Neck supple.  Skin:    General: Skin is warm and dry.     Capillary Refill: Capillary refill takes less than 2 seconds.  Neurological:     Mental Status: She is alert.  Psychiatric:  Mood and Affect: Mood normal.     ED Results / Procedures / Treatments   Labs (all labs ordered are listed, but only abnormal results are displayed) Labs Reviewed  RESP PANEL BY RT-PCR (RSV, FLU A&B, COVID)  RVPGX2 - Abnormal; Notable for the following components:      Result Value   SARS Coronavirus 2 by RT PCR POSITIVE (*)    All other components within normal limits    EKG None  Radiology No results found.  Procedures Procedures    Medications Ordered in ED Medications - No data to display  ED Course/ Medical Decision Making/ A&P                           Medical Decision Making  Patient presents with chief complaint of cough. Differential diagnosis includes but is not limited to Covid, influenza, RSV, and others  I reviewed the patient's  past medical history. The patient has no recent relevant past medical history. The patient has prediabetes as a comorbidity  There is no indication at this time for imaging. The patient's lungs are clear to auscultation bilaterally.   I ordered and reviewed labs. Pertinent results include positive Covid-19 test  The patient tested positive for COVID-19.  This diagnosis is consistent with her history and physical.  Plan to discharge patient home with prescription for Paxlovid.  I was able to find metabolic panel from a few months ago that showed a GFR greater than 60.  Patient provided instructions on supportive care.  Return precautions provided including significant shortness of breath        Final Clinical Impression(s) / ED Diagnoses Final diagnoses:  COVID-19    Rx / DC Orders ED Discharge Orders          Ordered    nirmatrelvir/ritonavir (PAXLOVID) 20 x 150 MG & 10 x 100MG  TABS  2 times daily        06/04/22 0956              06/06/22, PA-C 06/04/22 1010    06/06/22, MD 06/06/22 1711

## 2022-06-04 NOTE — Discharge Instructions (Addendum)
You were diagnosed today with COVID-19.  I have prescribed Paxlovid, the antiviral medication for COVID-19.  You may take Advil or Tylenol as needed for fever and symptom management.  If you develop severe shortness of breath please return to the emergency department for reevaluation.  I have attached educational information on quarantine and isolation.

## 2022-06-04 NOTE — ED Triage Notes (Signed)
Patient presents to ED via POV from home. Here with cough and concerns for COVID-19. Symptoms began this morning. Patients husband had COVID 2 weeks ago. Well appearing. Ambulatory.

## 2022-06-08 NOTE — Progress Notes (Unsigned)
[facility] at [facility] [address] [place], [place] [address] [phone]  Date:  06/09/2022   Name:  Courtney Ayala   DOB:  Oct 12, 1940   MRN:  [id number]  PCP:  [name], MD    Chief Complaint: No chief complaint on file.   History of Present Illness:  Courtney Ayala is a 81 y.o. very pleasant female patient who presents with the following:  Patient seen today to follow-up from recent bout with COVID-19 History of hypothyroidism, osteoporosis, dyslipidemia, prediabetes I saw her most recently in September.  She was seen in the ER on 12/23-tested positive for COVID She was given Paxlovid and released to home Patient Active Problem List   Diagnosis Date Noted   Knee osteoarthritis 06/13/2017   Osteoporosis 06/12/2017   Pre-diabetes 08/27/2016   Postablative hypothyroidism 05/09/2016   Dyslipidemia 05/09/2016   Pelvic organ prolapse quantification stage 1 cystocele 03/30/2016    Past Medical History:  Diagnosis Date   Arthritis    B/L knees   Hyperlipidemia    Neuromuscular disorder (HCC)    polymyalgia   Osteoporosis 06/12/2017   Thyroid disease     Past Surgical History:  Procedure Laterality Date   cataract surgery     ECTOPIC PREGNANCY SURGERY  1972    Social History   Tobacco Use   Smoking status: Never   Smokeless tobacco: Never  Vaping Use   Vaping Use: Never used  Substance Use Topics   Alcohol use: No   Drug use: No    Family History  Problem Relation Age of Onset   Hypertension Maternal Grandmother    Cancer Neg Hx    Stroke Neg Hx    Diabetes Neg Hx    Colon cancer Neg Hx    Breast cancer Neg Hx     No Known Allergies  Medication list has been reviewed and updated.  Current Outpatient Medications on File Prior to Visit  Medication Sig Dispense Refill   atorvastatin (LIPITOR) 10 MG tablet TAKE 1 TABLET DAILY 90 tablet 3   Bromfenac Sodium (PROLENSA) 0.07 % SOLN Apply to eye  in the morning and at bedtime.     Calcium Carbonate (CALCIUM 600 PO) Take 600 mg by mouth.     CRANBERRY PO Take by mouth.     estradiol (ESTRACE) 0.1 MG/GM vaginal cream PLACE 0.5 GRAMS VAGINALLY TWICE WEEKLY 42.5 g 2   Ferrous Sulfate (IRON) 325 (65 Fe) MG TABS Take by mouth.     levothyroxine (SYNTHROID) 88 MCG tablet Take 1 tablet (88 mcg total) by mouth daily before breakfast. 90 tablet 3   nirmatrelvir/ritonavir (PAXLOVID) 20 x 150 MG & 10 x 100MG  TABS Take 3 tablets by mouth 2 (two) times daily for 5 days. Patient GFR is 65. Take nirmatrelvir (150 mg) two tablets twice daily for 5 days and ritonavir (100 mg) one tablet twice daily for 5 days. 30 tablet 0   Omega-3 Fatty Acids (FISH OIL) 1000 MG CAPS Take 1,000 mg by mouth.     vitamin B-12 (CYANOCOBALAMIN) 100 MCG tablet Take 100 mcg by mouth daily.     No current facility-administered medications on file prior to visit.    Review of Systems:  As per HPI- otherwise negative.   Physical Examination: There were no vitals filed for this visit. There were no vitals filed for this visit. There is no height or weight on file to calculate BMI. Ideal Body  Weight:    GEN: no acute distress. HEENT: Atraumatic, Normocephalic.  Ears and Nose: No external deformity. CV: RRR, No M/G/R. No JVD. No thrill. No extra heart sounds. PULM: CTA B, no wheezes, crackles, rhonchi. No retractions. No resp. distress. No accessory muscle use. ABD: S, NT, ND, +BS. No rebound. No HSM. EXTR: No c/c/e PSYCH: Normally interactive. Conversant.    Assessment and Plan: ***  Signed Lamar Blinks, MD

## 2022-06-09 ENCOUNTER — Ambulatory Visit (INDEPENDENT_AMBULATORY_CARE_PROVIDER_SITE_OTHER): Payer: Medicare Other | Admitting: Family Medicine

## 2022-06-09 VITALS — BP 132/60 | HR 60 | Temp 97.6°F | Resp 18 | Ht 67.0 in | Wt 131.0 lb

## 2022-06-09 DIAGNOSIS — U071 COVID-19: Secondary | ICD-10-CM | POA: Diagnosis not present

## 2022-06-09 DIAGNOSIS — Z09 Encounter for follow-up examination after completed treatment for conditions other than malignant neoplasm: Secondary | ICD-10-CM

## 2022-06-09 NOTE — Patient Instructions (Signed)
It was good to see you today Let me know if you are not continuing to get back to normal or if you start getting worse

## 2022-06-16 ENCOUNTER — Encounter (INDEPENDENT_AMBULATORY_CARE_PROVIDER_SITE_OTHER): Payer: Medicare Other | Admitting: Ophthalmology

## 2022-06-20 ENCOUNTER — Encounter (INDEPENDENT_AMBULATORY_CARE_PROVIDER_SITE_OTHER): Payer: Medicare Other | Admitting: Ophthalmology

## 2022-06-20 DIAGNOSIS — H353132 Nonexudative age-related macular degeneration, bilateral, intermediate dry stage: Secondary | ICD-10-CM

## 2022-06-20 DIAGNOSIS — H43813 Vitreous degeneration, bilateral: Secondary | ICD-10-CM

## 2022-06-20 DIAGNOSIS — H59032 Cystoid macular edema following cataract surgery, left eye: Secondary | ICD-10-CM | POA: Diagnosis not present

## 2022-07-25 ENCOUNTER — Encounter (INDEPENDENT_AMBULATORY_CARE_PROVIDER_SITE_OTHER): Payer: Medicare Other | Admitting: Ophthalmology

## 2022-07-25 DIAGNOSIS — H59032 Cystoid macular edema following cataract surgery, left eye: Secondary | ICD-10-CM | POA: Diagnosis not present

## 2022-07-25 DIAGNOSIS — H353132 Nonexudative age-related macular degeneration, bilateral, intermediate dry stage: Secondary | ICD-10-CM

## 2022-07-25 DIAGNOSIS — H43813 Vitreous degeneration, bilateral: Secondary | ICD-10-CM | POA: Diagnosis not present

## 2022-09-05 ENCOUNTER — Encounter (INDEPENDENT_AMBULATORY_CARE_PROVIDER_SITE_OTHER): Payer: Medicare Other | Admitting: Ophthalmology

## 2022-09-05 DIAGNOSIS — H59032 Cystoid macular edema following cataract surgery, left eye: Secondary | ICD-10-CM | POA: Diagnosis not present

## 2022-09-05 DIAGNOSIS — H43813 Vitreous degeneration, bilateral: Secondary | ICD-10-CM | POA: Diagnosis not present

## 2022-09-05 DIAGNOSIS — H353132 Nonexudative age-related macular degeneration, bilateral, intermediate dry stage: Secondary | ICD-10-CM | POA: Diagnosis not present

## 2022-10-07 ENCOUNTER — Ambulatory Visit (INDEPENDENT_AMBULATORY_CARE_PROVIDER_SITE_OTHER): Payer: Medicare Other | Admitting: Surgical

## 2022-10-07 DIAGNOSIS — M79605 Pain in left leg: Secondary | ICD-10-CM | POA: Diagnosis not present

## 2022-10-08 ENCOUNTER — Encounter: Payer: Self-pay | Admitting: Surgical

## 2022-10-08 NOTE — Progress Notes (Signed)
Office Visit Note   Patient: Courtney Ayala           Date of Birth: 11-Jan-1941           MRN: [id number] Visit Date: 10/07/2022 Requested by: [name], MD [address] [place],  [place] [address] PCP: [name], MD  Subjective: Chief Complaint  Patient presents with   Left Knee - Pain    HPI: Courtney Ayala is a 82 y.o. female who presents to the office reporting left leg pain.  Patient states that pain started in November and has been worsening over the last several months especially in the last 3 months.  She states that she has pain in the back of her knee when going up and down stairs.  She has pain diffusely throughout the left knee but also involves the majority of her leg.  She has radiation of pain that goes down from her low back into the posterior aspect of the thigh into the knee and down to the bottom of her foot at times.  She also has increased pain in the low back that is bothering her more recently.  She has numbness and tingling in this leg distribution as well.  Denies any history of recent injury.  She has had left knee gel injection in January 2023 that gave her good relief of her knee pain..                ROS: All systems reviewed are negative as they relate to the chief complaint within the history of present illness.  Patient denies fevers or chills.  Assessment & Plan: Visit Diagnoses:  1. Pain in left leg     Plan: Patient is a 82 year old female who presents for evaluation of left leg pain.  She notes diffuse left knee pain but also has radicular pain down the majority of her left lower extremity that extends from the low back into the posterior thigh down to the bottom of her foot.  She has numbness and tingling in this distribution as well.  She has prior left knee gel injection that gave her good relief of her knee pain for some time but she does not have a lot of significant degenerative changes on her left knee radiographs from  December 2022.  She does have significantly more pathology that is noted on the lumbar spine radiographs from December 2022 that demonstrates mild spinal asymmetry of the lumbar spine with severe endplate degenerative changes and loss of disc space at L4-L5.  We discussed options available patient including cortisone injection of the left knee versus preapproval for gel injection of the left knee versus proceeding with MRI of the lumbar spine for further evaluation of this radicular pain.  She would like to proceed with MRI.  MRI ordered and follow-up after to review results.  Follow-Up Instructions: No follow-ups on file.   Orders:  Orders Placed This Encounter  Procedures   MR Lumbar Spine w/o contrast   No orders of the defined types were placed in this encounter.     Procedures: No procedures performed   Clinical Data: No additional findings.  Objective: Vital Signs: There were no vitals taken for this visit.  Physical Exam:  Constitutional: Patient appears well-developed HEENT:  Head: Normocephalic Eyes:EOM are normal Neck: Normal range of motion Cardiovascular: Normal rate Pulmonary/chest: Effort normal Neurologic: Patient is alert Skin: Skin is warm Psychiatric: Patient has normal mood and affect  Ortho Exam:  Ortho exam demonstrates left knee with trace effusion.  She has minimal tenderness over the medial and lateral joint lines.  She has excellent hip flexion, quadricep, hamstring, dorsiflexion, plantarflexion strength.  Negative FADIR sign.  Negative Stinchfield sign.  She is able to perform straight leg raise without extensor lag.  No calf tenderness.  Negative Homans' sign.  Stable to anterior posterior drawer sign.  Stable to varus and valgus stress at 0 and 30 degrees.  No clonus bilaterally.  Negative straight leg raise.  Specialty Comments:  No specialty comments available.  Imaging: No results found.   PMFS History: Patient Active Problem List    Diagnosis Date Noted   Knee osteoarthritis 06/13/2017   Osteoporosis 06/12/2017   Pre-diabetes 08/27/2016   Postablative hypothyroidism 05/09/2016   Dyslipidemia 05/09/2016   Pelvic organ prolapse quantification stage 1 cystocele 03/30/2016   Past Medical History:  Diagnosis Date   Arthritis    B/L knees   Hyperlipidemia    Neuromuscular disorder (HCC)    polymyalgia   Osteoporosis 06/12/2017   Thyroid disease     Family History  Problem Relation Age of Onset   Hypertension Maternal Grandmother    Cancer Neg Hx    Stroke Neg Hx    Diabetes Neg Hx    Colon cancer Neg Hx    Breast cancer Neg Hx     Past Surgical History:  Procedure Laterality Date   cataract surgery     ECTOPIC PREGNANCY SURGERY  1972   Social History   Occupational History   Not on file  Tobacco Use   Smoking status: Never   Smokeless tobacco: Never  Vaping Use   Vaping Use: Never used  Substance and Sexual Activity   Alcohol use: No   Drug use: No   Sexual activity: Not Currently    Birth control/protection: Post-menopausal

## 2022-10-18 ENCOUNTER — Encounter (INDEPENDENT_AMBULATORY_CARE_PROVIDER_SITE_OTHER): Payer: Medicare Other | Admitting: Ophthalmology

## 2022-10-18 DIAGNOSIS — H43813 Vitreous degeneration, bilateral: Secondary | ICD-10-CM

## 2022-10-18 DIAGNOSIS — H59032 Cystoid macular edema following cataract surgery, left eye: Secondary | ICD-10-CM

## 2022-10-18 DIAGNOSIS — H353132 Nonexudative age-related macular degeneration, bilateral, intermediate dry stage: Secondary | ICD-10-CM

## 2022-10-31 ENCOUNTER — Other Ambulatory Visit: Payer: Medicare Other

## 2022-11-01 NOTE — Patient Instructions (Signed)
It was great to see again today, I will be in touch with your labs as soon as possible Recommend the shingles vaccine series and a COVID booster if not done in the last 9 months or so Assuming all is well please see me in about 6 months

## 2022-11-01 NOTE — Progress Notes (Addendum)
[facility] [address] [place], [place] [address] [phone]  Date:  11/02/2022   Name:  Courtney Ayala   DOB:  11/29/1940   MRN:  [id number]  PCP:  [name], MD    Chief Complaint: Follow-up (Concerns/ questions: 1. concerns about pre-DM 2. Possible UTI. 3. MRI at [facility])   History of Present Illness:  Courtney Ayala is a 82 y.o. very pleasant female patient who presents with the following:  Patient seen today for follow-up-I saw her most recently in December when she had recently had COVID-19 History of hypothyroidism, osteoporosis, dyslipidemia, prediabetes, B12 deficiency Most recent blood work on chart from August, can update labs today  Atorvastatin 10 Vaginal estrogen cream Levothyroxine 88 Omega-3 B12  Recommend shingles vaccine and COVID booster Mammogram 10/22 Bone density 10/22, up-to-date  She has noted some mild dysuria over the last few days and would like to make sure all is well No hematuria, no back pain, no fever or chills She notes she will have an MRI scan I believe of her left knee coming up per orthopedics  Overall she is feeling well, no major concerns   Patient Active Problem List   Diagnosis Date Noted   Knee osteoarthritis 06/13/2017   Osteoporosis 06/12/2017   Pre-diabetes 08/27/2016   Postablative hypothyroidism 05/09/2016   Dyslipidemia 05/09/2016   Pelvic organ prolapse quantification stage 1 cystocele 03/30/2016    Past Medical History:  Diagnosis Date   Arthritis    B/L knees   Hyperlipidemia    Neuromuscular disorder (HCC)    polymyalgia   Osteoporosis 06/12/2017   Thyroid disease     Past Surgical History:  Procedure Laterality Date   cataract surgery     ECTOPIC PREGNANCY SURGERY  1972    Social History   Tobacco Use   Smoking status: Never   Smokeless tobacco: Never  Vaping Use   Vaping Use: Never used  Substance Use Topics   Alcohol  use: No   Drug use: No    Family History  Problem Relation Age of Onset   Hypertension Maternal Grandmother    Cancer Neg Hx    Stroke Neg Hx    Diabetes Neg Hx    Colon cancer Neg Hx    Breast cancer Neg Hx     No Known Allergies  Medication list has been reviewed and updated.  Current Outpatient Medications on File Prior to Visit  Medication Sig Dispense Refill   atorvastatin (LIPITOR) 10 MG tablet TAKE 1 TABLET DAILY 90 tablet 3   Bromfenac Sodium (PROLENSA) 0.07 % SOLN Apply to eye in the morning and at bedtime.     Calcium Carbonate (CALCIUM 600 PO) Take 600 mg by mouth.     CRANBERRY PO Take by mouth.     Ferrous Sulfate (IRON) 325 (65 Fe) MG TABS Take by mouth.     levothyroxine (SYNTHROID) 88 MCG tablet Take 1 tablet (88 mcg total) by mouth daily before breakfast. 90 tablet 3   Omega-3 Fatty Acids (FISH OIL) 1000 MG CAPS Take 1,000 mg by mouth.     vitamin B-12 (CYANOCOBALAMIN) 100 MCG tablet Take 100 mcg by mouth daily.     No current facility-administered medications on file prior to visit.    Review of Systems:  As per HPI- otherwise negative.   Physical Examination: Vitals:   11/02/22 1359  BP: 122/80  Pulse: 65  Resp: 18  Temp: 98.3 F (36.8 C)  SpO2: 95%   Vitals:   11/02/22 1359  Weight: 131 lb 6.4 oz (59.6 kg)  Height: 5\' 7"  (1.702 m)   Body mass index is 20.58 kg/m. Ideal Body Weight: Weight in (lb) to have BMI = 25: 159.3  GEN: no acute distress.  Normal weight, looks well and younger than age HEENT: Atraumatic, Normocephalic.  Ears and Nose: No external deformity. CV: RRR, No M/G/R. No JVD. No thrill. No extra heart sounds. PULM: CTA B, no wheezes, crackles, rhonchi. No retractions. No resp. distress. No accessory muscle use. ABD: S, NT, ND, +BS. No rebound. No HSM.  No CVA tenderness, belly is benign EXTR: No c/c/e PSYCH: Normally interactive. Conversant.   Results for orders placed or performed in visit on 11/02/22  Urine Culture    Specimen: Urine  Result Value Ref Range   MICRO NUMBER: [id number]    SPECIMEN QUALITY: Adequate    Sample Source URINE    STATUS: FINAL    ISOLATE 1: Escherichia coli (A)       Susceptibility   Escherichia coli - URINE CULTURE, REFLEX    AMOX/CLAVULANIC <=2 Sensitive     AMPICILLIN <=2 Sensitive     AMPICILLIN/SULBACTAM <=2 Sensitive     CEFAZOLIN* <=4 Not Reportable      * For infections other than uncomplicated UTI caused by E. coli, K. pneumoniae or P. mirabilis: Cefazolin is resistant if MIC > or = 8 mcg/mL. (Distinguishing susceptible versus intermediate for isolates with MIC < or = 4 mcg/mL requires additional testing.) For uncomplicated UTI caused by E. coli, K. pneumoniae or P. mirabilis: Cefazolin is susceptible if MIC <32 mcg/mL and predicts susceptible to the oral agents cefaclor, cefdinir, cefpodoxime, cefprozil, cefuroxime, cephalexin and loracarbef.     CEFTAZIDIME <=1 Sensitive     CEFEPIME <=1 Sensitive     CEFTRIAXONE <=1 Sensitive     CIPROFLOXACIN <=0.25 Sensitive     LEVOFLOXACIN <=0.12 Sensitive     GENTAMICIN <=1 Sensitive     IMIPENEM <=0.25 Sensitive     NITROFURANTOIN <=16 Sensitive     PIP/TAZO <=4 Sensitive     TOBRAMYCIN <=1 Sensitive     TRIMETH/SULFA* <=20 Sensitive      * For infections other than uncomplicated UTI caused by E. coli, K. pneumoniae or P. mirabilis: Cefazolin is resistant if MIC > or = 8 mcg/mL. (Distinguishing susceptible versus intermediate for isolates with MIC < or = 4 mcg/mL requires additional testing.) For uncomplicated UTI caused by E. coli, K. pneumoniae or P. mirabilis: Cefazolin is susceptible if MIC <32 mcg/mL and predicts susceptible to the oral agents cefaclor, cefdinir, cefpodoxime, cefprozil, cefuroxime, cephalexin and loracarbef. Legend: S = Susceptible  I = Intermediate R = Resistant  NS = Not susceptible * = Not tested  NR = Not reported **NN = See antimicrobic comments   CBC  Result Value Ref  Range   WBC 4.4 4.0 - 10.5 K/uL   RBC 4.17 3.87 - 5.11 Mil/uL   Platelets 143.0 (L) 150.0 - 400.0 K/uL   Hemoglobin 13.3 12.0 - 15.0 g/dL   HCT 54.0 98.1 - 19.1 %   MCV 97.9 78.0 - 100.0 fl   MCHC 32.7 30.0 - 36.0 g/dL   RDW 47.8 29.5 - 62.1 %  Comprehensive metabolic panel  Result Value Ref Range   Sodium 139 135 - 145 mEq/L   Potassium 3.9 3.5 - 5.1 mEq/L   Chloride 105 96 - 112 mEq/L  CO2 27 19 - 32 mEq/L   Glucose, Bld 101 (H) 70 - 99 mg/dL   BUN 16 6 - 23 mg/dL   Creatinine, Ser 3.08 0.40 - 1.20 mg/dL   Total Bilirubin 0.7 0.2 - 1.2 mg/dL   Alkaline Phosphatase 66 39 - 117 U/L   AST 16 0 - 37 U/L   ALT 11 0 - 35 U/L   Total Protein 6.4 6.0 - 8.3 g/dL   Albumin 4.0 3.5 - 5.2 g/dL   GFR 65.78 (L) >46.96 mL/min   Calcium 9.2 8.4 - 10.5 mg/dL  Hemoglobin E9B  Result Value Ref Range   Hgb A1c MFr Bld 6.1 4.6 - 6.5 %  Lipid panel  Result Value Ref Range   Cholesterol 244 (H) 0 - 200 mg/dL   Triglycerides 28.4 0.0 - 149.0 mg/dL   HDL 132.44 >01.02 mg/dL   VLDL 72.5 0.0 - 36.6 mg/dL   LDL Cholesterol 440 (H) 0 - 99 mg/dL   Total CHOL/HDL Ratio 2    NonHDL 140.73   TSH  Result Value Ref Range   TSH 0.48 0.35 - 5.50 uIU/mL  B12  Result Value Ref Range   Vitamin B-12 109 (L) 211 - 911 pg/mL  POCT urinalysis dipstick  Result Value Ref Range   Color, UA yellow yellow   Clarity, UA cloudy (A) clear   Glucose, UA negative negative mg/dL   Bilirubin, UA negative negative   Ketones, POC UA negative negative mg/dL   Spec Grav, UA 3.474 2.595 - 1.025   Blood, UA negative negative   pH, UA 6.5 5.0 - 8.0   Protein Ur, POC negative negative mg/dL   Urobilinogen, UA 0.2 0.2 or 1.0 E.U./dL   Nitrite, UA Negative Negative   Leukocytes, UA Negative Negative    Assessment and Plan: B12 deficiency - Plan: B12  Postablative hypothyroidism - Plan: TSH  Pre-diabetes - Plan: Comprehensive metabolic panel, Hemoglobin A1c  Dyslipidemia - Plan: Lipid panel  Thrombocytopenia  (HCC) - Plan: CBC  Fatigue, unspecified type  Urinary urgency - Plan: estradiol (ESTRACE) 0.1 MG/GM vaginal cream  Acute cystitis without hematuria - Plan: estradiol (ESTRACE) 0.1 MG/GM vaginal cream  Dysuria - Plan: Urine Culture, POCT urinalysis dipstick  Seen today for a few issues as described above. She is concerned about a possible UTI, UA appears benign.  Urine culture pending Follow-up of prediabetes Follow-up on thrombocytopenia, dyslipidemia, hypothyroidism and B12 with lab work as above Will plan further follow- up pending labs.   Signed Abbe Amsterdam, MD  Addendum 5/23-received labs as below, message to patient  Addnd 5/24- received + urine culture  Called pt and LMOM< will also send a mychart message Rx keflex  Results for orders placed or performed in visit on 11/02/22  Urine Culture   Specimen: Urine  Result Value Ref Range   MICRO NUMBER: [id number]    SPECIMEN QUALITY: Adequate    Sample Source URINE    STATUS: FINAL    ISOLATE 1: Escherichia coli (A)       Susceptibility   Escherichia coli - URINE CULTURE, REFLEX    AMOX/CLAVULANIC <=2 Sensitive     AMPICILLIN <=2 Sensitive     AMPICILLIN/SULBACTAM <=2 Sensitive     CEFAZOLIN* <=4 Not Reportable      * For infections other than uncomplicated UTI caused by E. coli, K. pneumoniae or P. mirabilis: Cefazolin is resistant if MIC > or = 8 mcg/mL. (Distinguishing susceptible versus intermediate for isolates with MIC < or =  4 mcg/mL requires additional testing.) For uncomplicated UTI caused by E. coli, K. pneumoniae or P. mirabilis: Cefazolin is susceptible if MIC <32 mcg/mL and predicts susceptible to the oral agents cefaclor, cefdinir, cefpodoxime, cefprozil, cefuroxime, cephalexin and loracarbef.     CEFTAZIDIME <=1 Sensitive     CEFEPIME <=1 Sensitive     CEFTRIAXONE <=1 Sensitive     CIPROFLOXACIN <=0.25 Sensitive     LEVOFLOXACIN <=0.12 Sensitive     GENTAMICIN <=1 Sensitive     IMIPENEM  <=0.25 Sensitive     NITROFURANTOIN <=16 Sensitive     PIP/TAZO <=4 Sensitive     TOBRAMYCIN <=1 Sensitive     TRIMETH/SULFA* <=20 Sensitive      * For infections other than uncomplicated UTI caused by E. coli, K. pneumoniae or P. mirabilis: Cefazolin is resistant if MIC > or = 8 mcg/mL. (Distinguishing susceptible versus intermediate for isolates with MIC < or = 4 mcg/mL requires additional testing.) For uncomplicated UTI caused by E. coli, K. pneumoniae or P. mirabilis: Cefazolin is susceptible if MIC <32 mcg/mL and predicts susceptible to the oral agents cefaclor, cefdinir, cefpodoxime, cefprozil, cefuroxime, cephalexin and loracarbef. Legend: S = Susceptible  I = Intermediate R = Resistant  NS = Not susceptible * = Not tested  NR = Not reported **NN = See antimicrobic comments   CBC  Result Value Ref Range   WBC 4.4 4.0 - 10.5 K/uL   RBC 4.17 3.87 - 5.11 Mil/uL   Platelets 143.0 (L) 150.0 - 400.0 K/uL   Hemoglobin 13.3 12.0 - 15.0 g/dL   HCT 09.8 11.9 - 14.7 %   MCV 97.9 78.0 - 100.0 fl   MCHC 32.7 30.0 - 36.0 g/dL   RDW 82.9 56.2 - 13.0 %  Comprehensive metabolic panel  Result Value Ref Range   Sodium 139 135 - 145 mEq/L   Potassium 3.9 3.5 - 5.1 mEq/L   Chloride 105 96 - 112 mEq/L   CO2 27 19 - 32 mEq/L   Glucose, Bld 101 (H) 70 - 99 mg/dL   BUN 16 6 - 23 mg/dL   Creatinine, Ser 8.65 0.40 - 1.20 mg/dL   Total Bilirubin 0.7 0.2 - 1.2 mg/dL   Alkaline Phosphatase 66 39 - 117 U/L   AST 16 0 - 37 U/L   ALT 11 0 - 35 U/L   Total Protein 6.4 6.0 - 8.3 g/dL   Albumin 4.0 3.5 - 5.2 g/dL   GFR 78.46 (L) >96.29 mL/min   Calcium 9.2 8.4 - 10.5 mg/dL  Hemoglobin B2W  Result Value Ref Range   Hgb A1c MFr Bld 6.1 4.6 - 6.5 %  Lipid panel  Result Value Ref Range   Cholesterol 244 (H) 0 - 200 mg/dL   Triglycerides 41.3 0.0 - 149.0 mg/dL   HDL 244.01 >02.72 mg/dL   VLDL 53.6 0.0 - 64.4 mg/dL   LDL Cholesterol 034 (H) 0 - 99 mg/dL   Total CHOL/HDL Ratio 2    NonHDL  140.73   TSH  Result Value Ref Range   TSH 0.48 0.35 - 5.50 uIU/mL  B12  Result Value Ref Range   Vitamin B-12 109 (L) 211 - 911 pg/mL  POCT urinalysis dipstick  Result Value Ref Range   Color, UA yellow yellow   Clarity, UA cloudy (A) clear   Glucose, UA negative negative mg/dL   Bilirubin, UA negative negative   Ketones, POC UA negative negative mg/dL   Spec Grav, UA 7.425 9.563 - 1.025  Blood, UA negative negative   pH, UA 6.5 5.0 - 8.0   Protein Ur, POC negative negative mg/dL   Urobilinogen, UA 0.2 0.2 or 1.0 E.U./dL   Nitrite, UA Negative Negative   Leukocytes, UA Negative Negative   Addnd 5/24- received urine culture

## 2022-11-02 ENCOUNTER — Ambulatory Visit (INDEPENDENT_AMBULATORY_CARE_PROVIDER_SITE_OTHER): Payer: Medicare Other | Admitting: Family Medicine

## 2022-11-02 VITALS — BP 122/80 | HR 65 | Temp 98.3°F | Resp 18 | Ht 67.0 in | Wt 131.4 lb

## 2022-11-02 DIAGNOSIS — R5383 Other fatigue: Secondary | ICD-10-CM

## 2022-11-02 DIAGNOSIS — R300 Dysuria: Secondary | ICD-10-CM

## 2022-11-02 DIAGNOSIS — R7303 Prediabetes: Secondary | ICD-10-CM | POA: Diagnosis not present

## 2022-11-02 DIAGNOSIS — D696 Thrombocytopenia, unspecified: Secondary | ICD-10-CM

## 2022-11-02 DIAGNOSIS — E785 Hyperlipidemia, unspecified: Secondary | ICD-10-CM

## 2022-11-02 DIAGNOSIS — N3000 Acute cystitis without hematuria: Secondary | ICD-10-CM

## 2022-11-02 DIAGNOSIS — E890 Postprocedural hypothyroidism: Secondary | ICD-10-CM

## 2022-11-02 DIAGNOSIS — E538 Deficiency of other specified B group vitamins: Secondary | ICD-10-CM

## 2022-11-02 DIAGNOSIS — R3915 Urgency of urination: Secondary | ICD-10-CM

## 2022-11-02 LAB — POCT URINALYSIS DIP (MANUAL ENTRY)
Bilirubin, UA: NEGATIVE
Blood, UA: NEGATIVE
Glucose, UA: NEGATIVE mg/dL
Ketones, POC UA: NEGATIVE mg/dL
Leukocytes, UA: NEGATIVE
Nitrite, UA: NEGATIVE
Protein Ur, POC: NEGATIVE mg/dL
Spec Grav, UA: 1.015 (ref 1.010–1.025)
Urobilinogen, UA: 0.2 E.U./dL
pH, UA: 6.5 (ref 5.0–8.0)

## 2022-11-02 MED ORDER — ESTRADIOL 0.1 MG/GM VA CREA: TOPICAL_CREAM | VAGINAL | 2 refills | Status: DC

## 2022-11-03 ENCOUNTER — Encounter: Payer: Self-pay | Admitting: Family Medicine

## 2022-11-03 LAB — CBC
HCT: 40.8 % (ref 36.0–46.0)
Hemoglobin: 13.3 g/dL (ref 12.0–15.0)
MCHC: 32.7 g/dL (ref 30.0–36.0)
MCV: 97.9 fl (ref 78.0–100.0)
Platelets: 143 10*3/uL — ABNORMAL LOW (ref 150.0–400.0)
RBC: 4.17 Mil/uL (ref 3.87–5.11)
RDW: 13.7 % (ref 11.5–15.5)
WBC: 4.4 10*3/uL (ref 4.0–10.5)

## 2022-11-03 LAB — HEMOGLOBIN A1C: Hgb A1c MFr Bld: 6.1 % (ref 4.6–6.5)

## 2022-11-03 LAB — LIPID PANEL
Cholesterol: 244 mg/dL — ABNORMAL HIGH (ref 0–200)
HDL: 103.1 mg/dL (ref >39.00)
LDL Cholesterol: 129 mg/dL — ABNORMAL HIGH (ref 0–99)
NonHDL: 140.73
Total CHOL/HDL Ratio: 2
Triglycerides: 59 mg/dL (ref 0.0–149.0)
VLDL: 11.8 mg/dL (ref 0.0–40.0)

## 2022-11-03 LAB — COMPREHENSIVE METABOLIC PANEL
ALT: 11 U/L (ref 0–35)
AST: 16 U/L (ref 0–37)
Albumin: 4 g/dL (ref 3.5–5.2)
Alkaline Phosphatase: 66 U/L (ref 39–117)
BUN: 16 mg/dL (ref 6–23)
CO2: 27 mEq/L (ref 19–32)
Calcium: 9.2 mg/dL (ref 8.4–10.5)
Chloride: 105 mEq/L (ref 96–112)
Creatinine, Ser: 0.94 mg/dL (ref 0.40–1.20)
GFR: 56.09 mL/min — ABNORMAL LOW (ref >60.00)
Glucose, Bld: 101 mg/dL — ABNORMAL HIGH (ref 70–99)
Potassium: 3.9 mEq/L (ref 3.5–5.1)
Sodium: 139 mEq/L (ref 135–145)
Total Bilirubin: 0.7 mg/dL (ref 0.2–1.2)
Total Protein: 6.4 g/dL (ref 6.0–8.3)

## 2022-11-03 LAB — VITAMIN B12: Vitamin B-12: 109 pg/mL — ABNORMAL LOW (ref 211–911)

## 2022-11-03 LAB — TSH: TSH: 0.48 u[IU]/mL (ref 0.35–5.50)

## 2022-11-04 ENCOUNTER — Encounter: Payer: Self-pay | Admitting: Family Medicine

## 2022-11-04 LAB — URINE CULTURE
MICRO NUMBER:: 14990162
SPECIMEN QUALITY:: ADEQUATE

## 2022-11-04 MED ORDER — CEPHALEXIN 500 MG PO CAPS: 500.0000 mg | ORAL_CAPSULE | Freq: Two times a day (BID) | ORAL | 0 refills | Status: DC

## 2022-11-04 NOTE — Addendum Note (Signed)
Addended by: Abbe Amsterdam C on: 11/04/2022 01:29 PM   Modules accepted: Orders

## 2022-11-08 NOTE — Telephone Encounter (Signed)
MRI Lumbar Spine WO Contrast ordered by Harriette Bouillon (Ortho) for Low back pain, symptoms persist with > 6 wks treatment; eval L radic Dx: Pain in left leg.  Please advise:

## 2022-11-25 ENCOUNTER — Ambulatory Visit
Admission: RE | Admit: 2022-11-25 | Discharge: 2022-11-25 | Disposition: A | Discharge status: Home / Self Care | Payer: Medicare Other | Source: Ambulatory Visit | Attending: Surgical | Admitting: Surgical

## 2022-11-25 DIAGNOSIS — M79605 Pain in left leg: Secondary | ICD-10-CM

## 2022-11-29 ENCOUNTER — Encounter (INDEPENDENT_AMBULATORY_CARE_PROVIDER_SITE_OTHER): Payer: Medicare Other | Admitting: Ophthalmology

## 2022-11-29 DIAGNOSIS — H43813 Vitreous degeneration, bilateral: Secondary | ICD-10-CM | POA: Diagnosis not present

## 2022-11-29 DIAGNOSIS — H353132 Nonexudative age-related macular degeneration, bilateral, intermediate dry stage: Secondary | ICD-10-CM | POA: Diagnosis not present

## 2022-11-29 DIAGNOSIS — H59032 Cystoid macular edema following cataract surgery, left eye: Secondary | ICD-10-CM

## 2022-12-03 ENCOUNTER — Other Ambulatory Visit: Payer: Self-pay | Admitting: Family Medicine

## 2022-12-03 DIAGNOSIS — E785 Hyperlipidemia, unspecified: Secondary | ICD-10-CM

## 2022-12-05 ENCOUNTER — Other Ambulatory Visit (HOSPITAL_BASED_OUTPATIENT_CLINIC_OR_DEPARTMENT_OTHER): Payer: Self-pay

## 2022-12-05 DIAGNOSIS — M79605 Pain in left leg: Secondary | ICD-10-CM

## 2022-12-05 NOTE — Progress Notes (Signed)
I called Courtney Ayala.  We discussed her MRI results.  Can you cancel her appointment on 12/19/2022 with me and can we refer her to see Dr. Alvester Morin for L-spine ESI?

## 2022-12-19 ENCOUNTER — Ambulatory Visit: Payer: Medicare Other | Admitting: Surgical

## 2022-12-22 ENCOUNTER — Ambulatory Visit (INDEPENDENT_AMBULATORY_CARE_PROVIDER_SITE_OTHER): Payer: Medicare Other | Admitting: Physical Medicine and Rehabilitation

## 2022-12-22 ENCOUNTER — Other Ambulatory Visit: Payer: Self-pay

## 2022-12-22 VITALS — BP 152/86 | HR 53

## 2022-12-22 DIAGNOSIS — M5416 Radiculopathy, lumbar region: Secondary | ICD-10-CM

## 2022-12-22 MED ORDER — METHYLPREDNISOLONE ACETATE 80 MG/ML IJ SUSP
80.0000 mg | Freq: Once | INTRAMUSCULAR | Status: AC
  Administered 2022-12-22: 80 mg

## 2022-12-22 NOTE — Patient Instructions (Signed)

## 2022-12-22 NOTE — Progress Notes (Signed)
Functional Pain Scale - descriptive words and definitions  Moderate (4)   Constantly aware of pain, can complete ADLs with modification/sleep marginally affected at times/passive distraction is of no use, but active distraction gives some relief. Moderate range order  Average Pain 6   +Driver, -BT, -Dye Allergies.  Lower back pain on the left side that radiates into the left leg

## 2023-01-03 ENCOUNTER — Encounter (INDEPENDENT_AMBULATORY_CARE_PROVIDER_SITE_OTHER): Payer: Medicare Other | Admitting: Ophthalmology

## 2023-01-03 DIAGNOSIS — H59032 Cystoid macular edema following cataract surgery, left eye: Secondary | ICD-10-CM

## 2023-01-03 DIAGNOSIS — H353132 Nonexudative age-related macular degeneration, bilateral, intermediate dry stage: Secondary | ICD-10-CM

## 2023-01-03 DIAGNOSIS — H43813 Vitreous degeneration, bilateral: Secondary | ICD-10-CM | POA: Diagnosis not present

## 2023-01-11 NOTE — Procedures (Signed)
Lumbar Epidural Steroid Injection - Interlaminar Approach with Fluoroscopic Guidance  Patient: Courtney Ayala      Date of Birth: 19-Feb-1941 MRN: [id number] PCP: [name], MD      Visit Date: 12/22/2022   Universal Protocol:     Consent Given By: the patient  Position: PRONE  Additional Comments: Vital signs were monitored before and after the procedure. Patient was prepped and draped in the usual sterile fashion. The correct patient, procedure, and site was verified.   Injection Procedure Details:   Procedure diagnoses: Lumbar radiculopathy [M54.16]   Meds Administered:  Meds ordered this encounter  Medications   methylPREDNISolone acetate (DEPO-MEDROL) injection 80 mg     Laterality: Left  Location/Site:  L4-5  Needle: 3.5 in., 20 ga. Tuohy  Needle Placement: Paramedian epidural  Findings:   -Comments: Excellent flow of contrast into the epidural space.  Procedure Details: Using a paramedian approach from the side mentioned above, the region overlying the inferior lamina was localized under fluoroscopic visualization and the soft tissues overlying this structure were infiltrated with 4 ml. of 1% Lidocaine without Epinephrine. The Tuohy needle was inserted into the epidural space using a paramedian approach.   The epidural space was localized using loss of resistance along with counter oblique bi-planar fluoroscopic views.  After negative aspirate for air, blood, and CSF, a 2 ml. volume of Isovue-250 was injected into the epidural space and the flow of contrast was observed. Radiographs were obtained for documentation purposes.    The injectate was administered into the level noted above.   Additional Comments:  The patient tolerated the procedure well Dressing: 2 x 2 sterile gauze and Band-Aid    Post-procedure details: Patient was observed during the procedure. Post-procedure instructions were reviewed.  Patient left the clinic in stable condition.

## 2023-01-11 NOTE — Progress Notes (Signed)
Courtney Ayala - 82 y.o. female MRN [id number]  Date of birth: 02-28-1941  Office Visit Note: Visit Date: 12/22/2022 PCP: [name], MD Referred by: [name], MD  Subjective: Chief Complaint  Patient presents with   Lower Back - Pain   HPI:  Courtney Ayala is a 82 y.o. female who comes in today at the request of Karenann Cai, PA-C for planned Left L4-5 Lumbar Interlaminar epidural steroid injection with fluoroscopic guidance.  The patient has failed conservative care including home exercise, medications, time and activity modification.  This injection will be diagnostic and hopefully therapeutic.  Please see requesting physician notes for further details and justification.   ROS Otherwise per HPI.  Assessment & Plan: Visit Diagnoses:    ICD-10-CM   1. Lumbar radiculopathy  M54.16 XR C-ARM NO REPORT    Epidural Steroid injection    methylPREDNISolone acetate (DEPO-MEDROL) injection 80 mg      Plan: No additional findings.   Meds & Orders:  Meds ordered this encounter  Medications   methylPREDNISolone acetate (DEPO-MEDROL) injection 80 mg    Orders Placed This Encounter  Procedures   XR C-ARM NO REPORT   Epidural Steroid injection    Follow-up: Return for visit to requesting provider as needed.   Procedures: No procedures performed  Lumbar Epidural Steroid Injection - Interlaminar Approach with Fluoroscopic Guidance  Patient: Courtney Ayala      Date of Birth: 1940-10-10 MRN: [id number] PCP: [name], MD      Visit Date: 12/22/2022   Universal Protocol:     Consent Given By: the patient  Position: PRONE  Additional Comments: Vital signs were monitored before and after the procedure. Patient was prepped and draped in the usual sterile fashion. The correct patient, procedure, and site was verified.   Injection Procedure Details:   Procedure diagnoses: Lumbar radiculopathy [M54.16]   Meds Administered:  Meds ordered this encounter   Medications   methylPREDNISolone acetate (DEPO-MEDROL) injection 80 mg     Laterality: Left  Location/Site:  L4-5  Needle: 3.5 in., 20 ga. Tuohy  Needle Placement: Paramedian epidural  Findings:   -Comments: Excellent flow of contrast into the epidural space.  Procedure Details: Using a paramedian approach from the side mentioned above, the region overlying the inferior lamina was localized under fluoroscopic visualization and the soft tissues overlying this structure were infiltrated with 4 ml. of 1% Lidocaine without Epinephrine. The Tuohy needle was inserted into the epidural space using a paramedian approach.   The epidural space was localized using loss of resistance along with counter oblique bi-planar fluoroscopic views.  After negative aspirate for air, blood, and CSF, a 2 ml. volume of Isovue-250 was injected into the epidural space and the flow of contrast was observed. Radiographs were obtained for documentation purposes.    The injectate was administered into the level noted above.   Additional Comments:  The patient tolerated the procedure well Dressing: 2 x 2 sterile gauze and Band-Aid    Post-procedure details: Patient was observed during the procedure. Post-procedure instructions were reviewed.  Patient left the clinic in stable condition.   Clinical History: MRI LUMBAR SPINE WITHOUT CONTRAST   TECHNIQUE: Multiplanar, multisequence MR imaging of the lumbar spine was performed. No intravenous contrast was administered.   COMPARISON:  None Available.   FINDINGS: Segmentation:  Standard.   Alignment:  Physiologic.   Vertebrae: No acute fracture, evidence of discitis, or aggressive bone lesion.   Conus medullaris and cauda equina: Conus  extends to the L1-2 level. Conus and cauda equina appear normal.   Paraspinal and other soft tissues: No acute paraspinal abnormality.   Disc levels:   Disc spaces: Degenerative disease with severe disc height  loss and reactive endplate sclerosis at L3-4 and L4-5. Mild degenerative disease with disc height loss at L1-2 and L2-3.   T12-L1: Mild broad-based disc bulge. Mild bilateral facet arthropathy. No foraminal or central canal stenosis.   L1-L2: Mild broad-based disc bulge. Mild bilateral facet arthropathy. No spinal stenosis. Mild bilateral foraminal stenosis.   L2-L3: Mild broad-based disc bulge. Moderate bilateral facet arthropathy. No foraminal or central canal stenosis.   L3-L4: Mild broad-based disc bulge. Mild bilateral facet arthropathy. Mild bilateral foraminal stenosis. No spinal stenosis.   L4-L5: Broad-based disc bulge. Small central disc protrusion. Moderate-severe bilateral foraminal stenosis. Mild spinal stenosis.   L5-S1: Mild broad-based disc bulge. Mild bilateral facet arthropathy. Mild right foraminal stenosis. No left foraminal stenosis. No spinal stenosis.   IMPRESSION: 1. Diffuse lumbar spine spondylosis as described above. 2. No acute osseous injury of the lumbar spine.     Electronically Signed   By: Elige Ko M.D.   On: 12/04/2022 06:37     Objective:  VS:  HT:    WT:   BMI:     BP:(!) 152/86  HR:(!) 53bpm  TEMP: ( )  RESP:  Physical Exam Vitals and nursing note reviewed.  Constitutional:      General: She is not in acute distress.    Appearance: Normal appearance. She is not ill-appearing.  HENT:     Head: Normocephalic and atraumatic.     Right Ear: External ear normal.     Left Ear: External ear normal.  Eyes:     Extraocular Movements: Extraocular movements intact.  Cardiovascular:     Rate and Rhythm: Normal rate.     Pulses: Normal pulses.  Pulmonary:     Effort: Pulmonary effort is normal. No respiratory distress.  Abdominal:     General: There is no distension.     Palpations: Abdomen is soft.  Musculoskeletal:        General: Tenderness present.     Cervical back: Neck supple.     Right lower leg: No edema.     Left  lower leg: No edema.     Comments: Patient has good distal strength with no pain over the greater trochanters.  No clonus or focal weakness.  Skin:    Findings: No erythema, lesion or rash.  Neurological:     General: No focal deficit present.     Mental Status: She is alert and oriented to person, place, and time.     Sensory: No sensory deficit.     Motor: No weakness or abnormal muscle tone.     Coordination: Coordination normal.  Psychiatric:        Mood and Affect: Mood normal.        Behavior: Behavior normal.      Imaging: No results found.

## 2023-01-12 ENCOUNTER — Ambulatory Visit (INDEPENDENT_AMBULATORY_CARE_PROVIDER_SITE_OTHER): Payer: Medicare Other | Admitting: Physical Medicine and Rehabilitation

## 2023-01-12 ENCOUNTER — Encounter: Payer: Self-pay | Admitting: Physical Medicine and Rehabilitation

## 2023-01-12 DIAGNOSIS — M5416 Radiculopathy, lumbar region: Secondary | ICD-10-CM | POA: Diagnosis not present

## 2023-01-12 DIAGNOSIS — M5442 Lumbago with sciatica, left side: Secondary | ICD-10-CM

## 2023-01-12 DIAGNOSIS — G8929 Other chronic pain: Secondary | ICD-10-CM

## 2023-01-12 DIAGNOSIS — M48061 Spinal stenosis, lumbar region without neurogenic claudication: Secondary | ICD-10-CM

## 2023-01-12 NOTE — Progress Notes (Signed)
Functional Pain Scale - descriptive words and definitions  Uncomfortable (3)  Pain is present but can complete all ADL's/sleep is slightly affected and passive distraction only gives marginal relief. Mild range order  Average Pain 4  Lower back pain. Injection helped with pain. Pain only when raising left leg

## 2023-01-12 NOTE — Progress Notes (Signed)
Tammika Cadenhead - 82 y.o. female MRN [id number]  Date of birth: Sep 01, 1940  Office Visit Note: Visit Date: 01/12/2023 PCP: [name], MD Referred by: [name], MD  Subjective: Chief Complaint  Patient presents with   Lower Back - Pain   HPI: Verlisa Tackett is a 82 y.o. female who comes in today for evaluation of chronic left sided lower back pain radiating down left leg, most severe pain is localized to left knee. Pain ongoing for several years, worsens with movement and activity. She describes pain as numbness, tingling and soreness, currently rates as 5 out of 10. Recent lumbar MRI imaging exhibits small central disc protrusion and moderate-severe bilateral foraminal stenosis.at L4-L5. No high grade spinal canal stenosis. Patient underwent left L4 transforaminal epidural steroid injection in our office on 12/22/2022, she reports significant and sustained pain relief with this procedure, reports greater than 50% and increased functional ability. Patient is planning to start exercises classes at [facility] in the coming weeks. Patient denies focal weakness. No recent trauma or falls.    Review of Systems  Musculoskeletal:  Positive for back pain.  Neurological:  Positive for tingling. Negative for focal weakness and weakness.  All other systems reviewed and are negative.  Otherwise per HPI.  Assessment & Plan: Visit Diagnoses:    ICD-10-CM   1. Lumbar radiculopathy  M54.16     2. Chronic left-sided low back pain with left-sided sciatica  M54.42    G89.29     3. Foraminal stenosis of lumbar region  M48.061        Plan: Findings:  Chronic left sided lower back pain radiating down left leg, most severe pain is localized to left knee. Significant and sustained relief of pain with recent left L4 transforaminal epidural steroid injection performed in our office. She seems to be doing much better post injection. Next step is to monitor, if her pain persists we could look at  repeat injection infrequently as needed. I encouraged patient to remain active as tolerated. I instructed her to follow up with [place] as needed. No red flag symptoms noted upon exam today.     Meds & Orders: No orders of the defined types were placed in this encounter.  No orders of the defined types were placed in this encounter.   Follow-up: Return if symptoms worsen or fail to improve.   Procedures: No procedures performed      Clinical History: MRI LUMBAR SPINE WITHOUT CONTRAST   TECHNIQUE: Multiplanar, multisequence MR imaging of the lumbar spine was performed. No intravenous contrast was administered.   COMPARISON:  None Available.   FINDINGS: Segmentation:  Standard.   Alignment:  Physiologic.   Vertebrae: No acute fracture, evidence of discitis, or aggressive bone lesion.   Conus medullaris and cauda equina: Conus extends to the L1-2 level. Conus and cauda equina appear normal.   Paraspinal and other soft tissues: No acute paraspinal abnormality.   Disc levels:   Disc spaces: Degenerative disease with severe disc height loss and reactive endplate sclerosis at L3-4 and L4-5. Mild degenerative disease with disc height loss at L1-2 and L2-3.   T12-L1: Mild broad-based disc bulge. Mild bilateral facet arthropathy. No foraminal or central canal stenosis.   L1-L2: Mild broad-based disc bulge. Mild bilateral facet arthropathy. No spinal stenosis. Mild bilateral foraminal stenosis.   L2-L3: Mild broad-based disc bulge. Moderate bilateral facet arthropathy. No foraminal or central canal stenosis.   L3-L4: Mild broad-based disc bulge. Mild bilateral facet arthropathy.  Mild bilateral foraminal stenosis. No spinal stenosis.   L4-L5: Broad-based disc bulge. Small central disc protrusion. Moderate-severe bilateral foraminal stenosis. Mild spinal stenosis.   L5-S1: Mild broad-based disc bulge. Mild bilateral facet arthropathy. Mild right foraminal stenosis. No left  foraminal stenosis. No spinal stenosis.   IMPRESSION: 1. Diffuse lumbar spine spondylosis as described above. 2. No acute osseous injury of the lumbar spine.     Electronically Signed   By: Elige Ko M.D.   On: 12/04/2022 06:37   She reports that she has never smoked. She has never used smokeless tobacco.  Recent Labs    01/26/22 1043 11/02/22 1420  HGBA1C 6.1 6.1    Objective:  VS:  HT:    WT:   BMI:     BP:   HR: bpm  TEMP: ( )  RESP:  Physical Exam Vitals and nursing note reviewed.  HENT:     Head: Normocephalic and atraumatic.     Right Ear: External ear normal.     Left Ear: External ear normal.     Nose: Nose normal.     Mouth/Throat:     Mouth: Mucous membranes are moist.  Eyes:     Extraocular Movements: Extraocular movements intact.  Cardiovascular:     Rate and Rhythm: Normal rate.     Pulses: Normal pulses.  Pulmonary:     Effort: Pulmonary effort is normal.  Abdominal:     General: Abdomen is flat. There is no distension.  Musculoskeletal:        General: Tenderness present.     Cervical back: Normal range of motion.     Comments: Patient rises from seated position to standing without difficulty. Good lumbar range of motion. No pain noted with facet loading. 5/5 strength noted with bilateral hip flexion, knee flexion/extension, ankle dorsiflexion/plantarflexion and EHL. No clonus noted bilaterally. No pain upon palpation of greater trochanters. No pain with internal/external rotation of bilateral hips. Sensation intact bilaterally. Negative slump test bilaterally. Ambulates without aid, gait steady.     Skin:    General: Skin is warm and dry.     Capillary Refill: Capillary refill takes less than 2 seconds.  Neurological:     General: No focal deficit present.     Mental Status: She is alert and oriented to person, place, and time.  Psychiatric:        Mood and Affect: Mood normal.        Behavior: Behavior normal.     Ortho  Exam  Imaging: No results found.  Past Medical/Family/Surgical/Social History: Medications & Allergies reviewed per EMR, new medications updated. Patient Active Problem List   Diagnosis Date Noted   Knee osteoarthritis 06/13/2017   Osteoporosis 06/12/2017   Pre-diabetes 08/27/2016   Postablative hypothyroidism 05/09/2016   Dyslipidemia 05/09/2016   Pelvic organ prolapse quantification stage 1 cystocele 03/30/2016   Past Medical History:  Diagnosis Date   Arthritis    B/L knees   Hyperlipidemia    Neuromuscular disorder (HCC)    polymyalgia   Osteoporosis 06/12/2017   Thyroid disease    Family History  Problem Relation Age of Onset   Hypertension Maternal Grandmother    Cancer Neg Hx    Stroke Neg Hx    Diabetes Neg Hx    Colon cancer Neg Hx    Breast cancer Neg Hx    Past Surgical History:  Procedure Laterality Date   cataract surgery     ECTOPIC PREGNANCY SURGERY  1972  Social History   Occupational History   Not on file  Tobacco Use   Smoking status: Never   Smokeless tobacco: Never  Vaping Use   Vaping status: Never Used  Substance and Sexual Activity   Alcohol use: No   Drug use: No   Sexual activity: Not Currently    Birth control/protection: Post-menopausal

## 2023-02-28 ENCOUNTER — Encounter (INDEPENDENT_AMBULATORY_CARE_PROVIDER_SITE_OTHER): Payer: Medicare Other | Admitting: Ophthalmology

## 2023-02-28 DIAGNOSIS — H59032 Cystoid macular edema following cataract surgery, left eye: Secondary | ICD-10-CM | POA: Diagnosis not present

## 2023-02-28 DIAGNOSIS — H353132 Nonexudative age-related macular degeneration, bilateral, intermediate dry stage: Secondary | ICD-10-CM | POA: Diagnosis not present

## 2023-02-28 DIAGNOSIS — H43813 Vitreous degeneration, bilateral: Secondary | ICD-10-CM

## 2023-03-02 ENCOUNTER — Other Ambulatory Visit: Payer: Self-pay

## 2023-03-02 DIAGNOSIS — E890 Postprocedural hypothyroidism: Secondary | ICD-10-CM

## 2023-03-02 MED ORDER — LEVOTHYROXINE SODIUM 88 MCG PO TABS
88.0000 ug | ORAL_TABLET | Freq: Every day | ORAL | 3 refills | Status: DC
Start: 2023-03-02 — End: 2024-02-23

## 2023-05-29 ENCOUNTER — Encounter: Payer: Self-pay | Admitting: Physician Assistant

## 2023-05-29 ENCOUNTER — Telehealth: Payer: Self-pay | Admitting: Family Medicine

## 2023-05-29 ENCOUNTER — Ambulatory Visit (INDEPENDENT_AMBULATORY_CARE_PROVIDER_SITE_OTHER): Payer: Medicare Other | Admitting: Physician Assistant

## 2023-05-29 ENCOUNTER — Other Ambulatory Visit: Payer: Self-pay

## 2023-05-29 VITALS — BP 153/76 | HR 60 | Temp 98.0°F | Ht 67.0 in | Wt 131.4 lb

## 2023-05-29 DIAGNOSIS — J0100 Acute maxillary sinusitis, unspecified: Secondary | ICD-10-CM

## 2023-05-29 DIAGNOSIS — E785 Hyperlipidemia, unspecified: Secondary | ICD-10-CM

## 2023-05-29 MED ORDER — AMOXICILLIN 875 MG PO TABS
875.0000 mg | ORAL_TABLET | Freq: Two times a day (BID) | ORAL | 0 refills | Status: AC
Start: 2023-05-29 — End: 2023-06-05

## 2023-05-29 MED ORDER — ATORVASTATIN CALCIUM 10 MG PO TABS: 10.0000 mg | ORAL_TABLET | Freq: Every day | ORAL | 3 refills | Status: DC

## 2023-05-29 MED ORDER — AZELASTINE HCL 0.1 % NA SOLN: 1.0000 | Freq: Two times a day (BID) | NASAL | 0 refills | Status: DC

## 2023-05-29 NOTE — Telephone Encounter (Signed)
Pt came in stating she needs refill of atorvastatin to be sent in so she can have them by christmas.

## 2023-05-29 NOTE — Telephone Encounter (Signed)
Rx has been sent in. 

## 2023-05-29 NOTE — Progress Notes (Signed)
Established patient visit   Patient: Courtney Ayala   DOB: Jan 11, 1941   82 y.o. Female  MRN: [id number] Visit Date: 05/29/2023  Today's healthcare provider: Alfredia Ferguson, PA-C   Cc. Nasal congestion, headache, dizzy  Subjective     Pt reports nasal congestion, cough w/ phlegm, sinus pressure/ headaches, and occasional dizziness for the last 8-9 days.   Medications: Outpatient Medications Prior to Visit  Medication Sig   atorvastatin (LIPITOR) 10 MG tablet Take 1 tablet (10 mg total) by mouth daily.   Bromfenac Sodium (PROLENSA) 0.07 % SOLN Apply to eye in the morning and at bedtime.   Calcium Carbonate (CALCIUM 600 PO) Take 600 mg by mouth.   CRANBERRY PO Take by mouth.   estradiol (ESTRACE) 0.1 MG/GM vaginal cream PLACE 0.5 GRAMS VAGINALLY TWICE WEEKLY   Ferrous Sulfate (IRON) 325 (65 Fe) MG TABS Take by mouth.   levothyroxine (SYNTHROID) 88 MCG tablet Take 1 tablet (88 mcg total) by mouth daily before breakfast.   Omega-3 Fatty Acids (FISH OIL) 1000 MG CAPS Take 1,000 mg by mouth.   vitamin B-12 (CYANOCOBALAMIN) 100 MCG tablet Take 100 mcg by mouth daily.   [DISCONTINUED] cephALEXin (KEFLEX) 500 MG capsule Take 1 capsule (500 mg total) by mouth 2 (two) times daily.   No facility-administered medications prior to visit.    Review of Systems  Constitutional:  Negative for fatigue and fever.  HENT:  Positive for congestion, postnasal drip, rhinorrhea and sinus pressure.   Respiratory:  Negative for cough and shortness of breath.   Cardiovascular:  Negative for chest pain and leg swelling.  Gastrointestinal:  Negative for abdominal pain.  Neurological:  Positive for headaches. Negative for dizziness.       Objective    BP (!) 153/76   Pulse 60   Temp 98 F (36.7 C) (Oral)   Ht 5\' 7"  (1.702 m)   Wt 131 lb 6 oz (59.6 kg)   SpO2 97%   BMI 20.58 kg/m    Physical Exam Constitutional:      General: She is awake.     Appearance: She is well-developed.  HENT:      Head: Normocephalic.     Right Ear: Tympanic membrane normal.     Left Ear: Tympanic membrane normal.  Eyes:     Conjunctiva/sclera: Conjunctivae normal.  Cardiovascular:     Rate and Rhythm: Normal rate and regular rhythm.     Heart sounds: Normal heart sounds.  Pulmonary:     Effort: Pulmonary effort is normal.     Breath sounds: Normal breath sounds.  Skin:    General: Skin is warm.  Neurological:     Mental Status: She is alert and oriented to person, place, and time.  Psychiatric:        Attention and Perception: Attention normal.        Mood and Affect: Mood normal.        Speech: Speech normal.        Behavior: Behavior is cooperative.     No results found for any visits on 05/29/23.  Assessment & Plan    Acute non-recurrent maxillary sinusitis -     Amoxicillin; Take 1 tablet (875 mg total) by mouth 2 (two) times daily for 7 days.  Dispense: 14 tablet; Refill: 0 -     Azelastine HCl; Place 1 spray into both nostrils 2 (two) times daily. Use in each nostril as directed  Dispense: 30 mL; Refill: 0  Recommending rest, hydration, rx azelastine nasal spray  Rx amox bid x 7 days   Return if symptoms worsen or fail to improve.       Alfredia Ferguson, PA-C  [facility] [phone] (phone) [phone] (fax)  [facility]

## 2023-05-30 ENCOUNTER — Encounter (INDEPENDENT_AMBULATORY_CARE_PROVIDER_SITE_OTHER): Payer: Medicare Other | Admitting: Ophthalmology

## 2023-05-30 DIAGNOSIS — H43813 Vitreous degeneration, bilateral: Secondary | ICD-10-CM | POA: Diagnosis not present

## 2023-05-30 DIAGNOSIS — H59032 Cystoid macular edema following cataract surgery, left eye: Secondary | ICD-10-CM

## 2023-05-30 DIAGNOSIS — H353132 Nonexudative age-related macular degeneration, bilateral, intermediate dry stage: Secondary | ICD-10-CM

## 2023-08-28 ENCOUNTER — Encounter (INDEPENDENT_AMBULATORY_CARE_PROVIDER_SITE_OTHER): Payer: Medicare Other | Admitting: Ophthalmology

## 2023-09-14 ENCOUNTER — Encounter (INDEPENDENT_AMBULATORY_CARE_PROVIDER_SITE_OTHER): Admitting: Ophthalmology

## 2023-09-20 ENCOUNTER — Telehealth: Payer: Self-pay | Admitting: Surgical

## 2023-09-20 NOTE — Telephone Encounter (Signed)
 Pt request bil knee Monovisc Inj  Appt 10/03/25 @ 2:00pm with Franky Macho

## 2023-09-20 NOTE — Telephone Encounter (Signed)
 Will submit.

## 2023-09-21 NOTE — Telephone Encounter (Signed)
 VOB submitted for Monovisc, bilateral

## 2023-10-03 ENCOUNTER — Other Ambulatory Visit: Payer: Self-pay

## 2023-10-03 DIAGNOSIS — M25561 Pain in right knee: Secondary | ICD-10-CM

## 2023-10-04 ENCOUNTER — Ambulatory Visit (INDEPENDENT_AMBULATORY_CARE_PROVIDER_SITE_OTHER): Admitting: Surgical

## 2023-10-04 DIAGNOSIS — M170 Bilateral primary osteoarthritis of knee: Secondary | ICD-10-CM

## 2023-10-08 ENCOUNTER — Encounter: Payer: Self-pay | Admitting: Surgical

## 2023-10-08 MED ORDER — HYALURONAN 88 MG/4ML IX SOSY
88.0000 mg | PREFILLED_SYRINGE | INTRA_ARTICULAR | Status: AC | PRN
Start: 2023-10-04 — End: 2023-10-04
  Administered 2023-10-04: 88 mg via INTRA_ARTICULAR

## 2023-10-08 MED ORDER — LIDOCAINE HCL 1 % IJ SOLN
5.0000 mL | INTRAMUSCULAR | Status: AC | PRN
  Administered 2023-10-04: 5 mL

## 2023-10-08 NOTE — Progress Notes (Signed)
   Procedure Note  Patient: Courtney Ayala             Date of Birth: Dec 21, 1940           MRN: [id number]             Visit Date: 10/04/2023  Procedures: Visit Diagnoses:  1. Primary osteoarthritis of both knees     Large Joint Inj: bilateral knee on 10/04/2023 11:12 AM Indications: diagnostic evaluation, joint swelling and pain Details: 18 G 1.5 in needle, superolateral approach  Arthrogram: No  Medications (Right): 5 mL lidocaine  1 %; 88 mg Hyaluronan 88 MG/4ML Medications (Left): 5 mL lidocaine  1 %; 88 mg Hyaluronan 88 MG/4ML Outcome: tolerated well, no immediate complications Procedure, treatment alternatives, risks and benefits explained, specific risks discussed. Consent was given by the patient. Immediately prior to procedure a time out was called to verify the correct patient, procedure, equipment, support staff and site/side marked as required. Patient was prepped and draped in the usual sterile fashion.

## 2023-10-13 ENCOUNTER — Encounter (INDEPENDENT_AMBULATORY_CARE_PROVIDER_SITE_OTHER): Admitting: Ophthalmology

## 2023-10-13 DIAGNOSIS — H59032 Cystoid macular edema following cataract surgery, left eye: Secondary | ICD-10-CM | POA: Diagnosis not present

## 2023-10-13 DIAGNOSIS — H43813 Vitreous degeneration, bilateral: Secondary | ICD-10-CM

## 2023-10-13 DIAGNOSIS — H353132 Nonexudative age-related macular degeneration, bilateral, intermediate dry stage: Secondary | ICD-10-CM

## 2023-10-26 ENCOUNTER — Telehealth: Payer: Self-pay

## 2023-10-26 NOTE — Telephone Encounter (Signed)
 Thank you :)

## 2023-10-26 NOTE — Telephone Encounter (Signed)
 It is possible that patient's date of service can be resubmitted.  Patient came into office stating that she received a bill for service date 10/04/2023.  Patient has Medicare and [employer] and both do not require an authorization.  Please see referrals tab for all gel injection information.  Thank you.

## 2023-11-22 NOTE — Progress Notes (Addendum)
 [facility] at [facility] [address], [address] [place], [place] [address] [phone]  Date:  11/23/2023   Name:  Courtney Ayala   DOB:  1940-12-05   MRN:  [id number]  PCP:  Kaylee Partridge, MD    Chief Complaint: Follow-up (Weight loss over the last year /Sore big toe on R foot my husband accidentally dropped a hammer on my toe last year around the holidays)   History of Present Illness:  Courtney Ayala is a 83 y.o. very pleasant female patient who presents with the following:  Patient seen today for periodic follow-up.  Most recent visit with myself was about 1 year ago  History of hypothyroidism, osteoporosis, dyslipidemia, prediabetes, B12 deficiency Most recent blood work on chart from May 2024, can update labs today Her B12 was low last year-will need to ensure she is responding to oral treatment  Since her last visit she has followed up with orthopedics for knee pain and back pain  - she had knee injection which did help  She still has pain but it is manageable   Her husband accidentally dropped a hammer on her right great toe 6 months ago   She is dealing with vaginal prolapse She did see Dr Frutoso Jing for this issue about 3 years ago   Wt Readings from Last 3 Encounters:  11/23/23 125 lb 9.6 oz (57 kg)  05/29/23 131 lb 6 oz (59.6 kg)  11/02/22 131 lb 6.4 oz (59.6 kg)    Atorvastatin  10 Vaginal estrogen cream Levothyroxine  88 Omega-3 B12  She is taking 12 and vitamin d   Mammogram- pt would like to delay  Recommend Shingrix Recommend RSV Recommend keeping up with COVID boosters Patient Active Problem List   Diagnosis Date Noted   Knee osteoarthritis 06/13/2017   Osteoporosis 06/12/2017   Pre-diabetes 08/27/2016   Postablative hypothyroidism 05/09/2016   Dyslipidemia 05/09/2016   Pelvic organ prolapse quantification stage 1 cystocele 03/30/2016    Past Medical History:  Diagnosis Date   Arthritis    B/L knees    Hyperlipidemia    Neuromuscular disorder (HCC)    polymyalgia   Osteoporosis 06/12/2017   Thyroid  disease     Past Surgical History:  Procedure Laterality Date   cataract surgery     ECTOPIC PREGNANCY SURGERY  1972    Social History   Tobacco Use   Smoking status: Never   Smokeless tobacco: Never  Vaping Use   Vaping status: Never Used  Substance Use Topics   Alcohol use: No   Drug use: No    Family History  Problem Relation Age of Onset   Hypertension Maternal Grandmother    Cancer Neg Hx    Stroke Neg Hx    Diabetes Neg Hx    Colon cancer Neg Hx    Breast cancer Neg Hx     No Known Allergies  Medication list has been reviewed and updated.  Current Outpatient Medications on File Prior to Visit  Medication Sig Dispense Refill   atorvastatin  (LIPITOR) 10 MG tablet Take 1 tablet (10 mg total) by mouth daily. 90 tablet 3   Bromfenac Sodium (PROLENSA) 0.07 % SOLN Apply to eye in the morning and at bedtime.     Calcium  Carbonate (CALCIUM  600 PO) Take 600 mg by mouth.     CRANBERRY PO Take by mouth.     Ferrous Sulfate (IRON) 325 (65 Fe) MG TABS Take by mouth.  levothyroxine  (SYNTHROID ) 88 MCG tablet Take 1 tablet (88 mcg total) by mouth daily before breakfast. 90 tablet 3   Omega-3 Fatty Acids (FISH OIL) 1000 MG CAPS Take 1,000 mg by mouth.     vitamin B-12 (CYANOCOBALAMIN ) 100 MCG tablet Take 100 mcg by mouth daily.     azelastine  (ASTELIN ) 0.1 % nasal spray Place 1 spray into both nostrils 2 (two) times daily. Use in each nostril as directed (Patient not taking: Reported on 11/23/2023) 30 mL 0   estradiol  (ESTRACE ) 0.1 MG/GM vaginal cream PLACE 0.5 GRAMS VAGINALLY TWICE WEEKLY (Patient not taking: Reported on 11/23/2023) 42.5 g 2   No current facility-administered medications on file prior to visit.    Review of Systems:  As per HPI- otherwise negative.   Physical Examination: Vitals:   11/23/23 1444  BP: 130/82  Pulse: 60  SpO2: 100%   Vitals:    11/23/23 1444  Weight: 125 lb 9.6 oz (57 kg)  Height: 5' 7 (1.702 m)   Body mass index is 19.67 kg/m. Ideal Body Weight: Weight in (lb) to have BMI = 25: 159.3  GEN: no acute distress. Petite build, looks well  HEENT: Atraumatic, Normocephalic.  Ears and Nose: No external deformity. CV: RRR, No M/G/R. No JVD. No thrill. No extra heart sounds. PULM: CTA B, no wheezes, crackles, rhonchi. No retractions. No resp. distress. No accessory muscle use. ABD: S, NT, ND, +BS. No rebound. No HSM. EXTR: No c/c/e PSYCH: Normally interactive. Conversant.  Her right great toe is normal on exam-no bruising or nail abnormality, no discomfort with movement of MCP or IP joint.  Her toe is a bit tender when I press directly on the tip of the toe, she has atrophy of the fat pad Assessment and Plan: Dyslipidemia - Plan: Lipid panel  B12 deficiency - Plan: B12  Pre-diabetes - Plan: Comprehensive metabolic panel with GFR, Hemoglobin A1c  Postablative hypothyroidism - Plan: TSH  Thrombocytopenia (HCC) - Plan: CBC  Great toe pain, right - Plan: DG Foot Complete Right  Patient seen today for routine follow-up.  She is overall feeling quite well.  Routine blood work is pending as above.  Will follow-up on her thyroid  and prediabetes today.  She is taking vitamin B12, we will check on this level today.  She has been experiencing toe pain for several months since heavy object was dropped on her foot.  Her exam today is relatively benign but we will obtain an x-ray to make sure all looks okay  Will plan further follow- up pending labs.  Signed Gates Kasal, MD  Addendum 6/13, received labs as below.  Message to patient  Results for orders placed or performed in visit on 11/23/23  CBC   Collection Time: 11/23/23  3:11 PM  Result Value Ref Range   WBC 3.7 (L) 3.8 - 10.8 Thousand/uL   RBC 4.21 3.80 - 5.10 Million/uL   Hemoglobin 13.6 11.7 - 15.5 g/dL   HCT 65.7 84.6 - 96.2 %   MCV 97.6 80.0 - 100.0  fL   MCH 32.3 27.0 - 33.0 pg   MCHC 33.1 32.0 - 36.0 g/dL   RDW 95.2 84.1 - 32.4 %   Platelets 119 (L) 140 - 400 Thousand/uL   MPV 11.2 7.5 - 12.5 fL  Comprehensive metabolic panel with GFR   Collection Time: 11/23/23  3:11 PM  Result Value Ref Range   Glucose, Bld 83 65 - 99 mg/dL   BUN 16 7 - 25 mg/dL  Creat 1.01 (H) 0.60 - 0.95 mg/dL   eGFR 55 (L) > OR = 60 mL/min/1.83m2   BUN/Creatinine Ratio 16 6 - 22 (calc)   Sodium 142 135 - 146 mmol/L   Potassium 3.9 3.5 - 5.3 mmol/L   Chloride 105 98 - 110 mmol/L   CO2 27 20 - 32 mmol/L   Calcium  9.8 8.6 - 10.4 mg/dL   Total Protein 6.8 6.1 - 8.1 g/dL   Albumin 4.4 3.6 - 5.1 g/dL   Globulin 2.4 1.9 - 3.7 g/dL (calc)   AG Ratio 1.8 1.0 - 2.5 (calc)   Total Bilirubin 0.8 0.2 - 1.2 mg/dL   Alkaline phosphatase (APISO) 83 37 - 153 U/L   AST 18 10 - 35 U/L   ALT 13 6 - 29 U/L  Lipid panel   Collection Time: 11/23/23  3:11 PM  Result Value Ref Range   Cholesterol 237 (H) <200 mg/dL   HDL 161 > OR = 50 mg/dL   Triglycerides 64 <096 mg/dL   LDL Cholesterol (Calc) 114 (H) mg/dL (calc)   Total CHOL/HDL Ratio 2.2 <5.0 (calc)   Non-HDL Cholesterol (Calc) 130 (H) <130 mg/dL (calc)

## 2023-11-22 NOTE — Patient Instructions (Addendum)
 It was great to see you again today, I will be in touch with your blood work  Recommend a couple of immunizations at your pharmacy if not already: Shingrix RSV Keep COVID boosters up-to-date  We can do a mammogram for you if you like at your convenience We can have you see Dr Frutoso Jing again about your prolapse if you would like in the future!

## 2023-11-23 ENCOUNTER — Ambulatory Visit (INDEPENDENT_AMBULATORY_CARE_PROVIDER_SITE_OTHER): Admitting: Family Medicine

## 2023-11-23 ENCOUNTER — Encounter: Payer: Self-pay | Admitting: Family Medicine

## 2023-11-23 VITALS — BP 130/82 | HR 60 | Ht 67.0 in | Wt 125.6 lb

## 2023-11-23 DIAGNOSIS — M79674 Pain in right toe(s): Secondary | ICD-10-CM

## 2023-11-23 DIAGNOSIS — E785 Hyperlipidemia, unspecified: Secondary | ICD-10-CM

## 2023-11-23 DIAGNOSIS — D696 Thrombocytopenia, unspecified: Secondary | ICD-10-CM

## 2023-11-23 DIAGNOSIS — E890 Postprocedural hypothyroidism: Secondary | ICD-10-CM

## 2023-11-23 DIAGNOSIS — R7303 Prediabetes: Secondary | ICD-10-CM | POA: Diagnosis not present

## 2023-11-23 DIAGNOSIS — E538 Deficiency of other specified B group vitamins: Secondary | ICD-10-CM

## 2023-11-24 ENCOUNTER — Encounter: Payer: Self-pay | Admitting: Family Medicine

## 2023-11-24 LAB — COMPREHENSIVE METABOLIC PANEL WITH GFR
AG Ratio: 1.8 (calc) (ref 1.0–2.5)
ALT: 13 U/L (ref 6–29)
AST: 18 U/L (ref 10–35)
Albumin: 4.4 g/dL (ref 3.6–5.1)
Alkaline phosphatase (APISO): 83 U/L (ref 37–153)
BUN/Creatinine Ratio: 16 (calc) (ref 6–22)
BUN: 16 mg/dL (ref 7–25)
CO2: 27 mmol/L (ref 20–32)
Calcium: 9.8 mg/dL (ref 8.6–10.4)
Chloride: 105 mmol/L (ref 98–110)
Creat: 1.01 mg/dL — ABNORMAL HIGH (ref 0.60–0.95)
Globulin: 2.4 g/dL (ref 1.9–3.7)
Glucose, Bld: 83 mg/dL (ref 65–99)
Potassium: 3.9 mmol/L (ref 3.5–5.3)
Sodium: 142 mmol/L (ref 135–146)
Total Bilirubin: 0.8 mg/dL (ref 0.2–1.2)
Total Protein: 6.8 g/dL (ref 6.1–8.1)
eGFR: 55 mL/min/{1.73_m2} — ABNORMAL LOW (ref >=60)

## 2023-11-24 LAB — HEMOGLOBIN A1C
Hgb A1c MFr Bld: 5.8 % — ABNORMAL HIGH (ref <5.7)
Mean Plasma Glucose: 120 mg/dL
eAG (mmol/L): 6.6 mmol/L

## 2023-11-24 LAB — CBC
HCT: 41.1 % (ref 35.0–45.0)
Hemoglobin: 13.6 g/dL (ref 11.7–15.5)
MCH: 32.3 pg (ref 27.0–33.0)
MCHC: 33.1 g/dL (ref 32.0–36.0)
MCV: 97.6 fL (ref 80.0–100.0)
MPV: 11.2 fL (ref 7.5–12.5)
Platelets: 119 10*3/uL — ABNORMAL LOW (ref 140–400)
RBC: 4.21 10*6/uL (ref 3.80–5.10)
RDW: 12.4 % (ref 11.0–15.0)
WBC: 3.7 10*3/uL — ABNORMAL LOW (ref 3.8–10.8)

## 2023-11-24 LAB — LIPID PANEL
Cholesterol: 237 mg/dL — ABNORMAL HIGH (ref <200)
HDL: 107 mg/dL (ref >=50)
LDL Cholesterol (Calc): 114 mg/dL — ABNORMAL HIGH
Non-HDL Cholesterol (Calc): 130 mg/dL — ABNORMAL HIGH (ref <130)
Total CHOL/HDL Ratio: 2.2 (calc) (ref <5.0)
Triglycerides: 64 mg/dL (ref <150)

## 2023-11-24 LAB — VITAMIN B12: Vitamin B-12: 516 pg/mL (ref 200–1100)

## 2023-11-24 LAB — TSH: TSH: 0.45 m[IU]/L (ref 0.40–4.50)

## 2023-11-24 NOTE — Addendum Note (Signed)
 Addended by: Gates Kasal C on: 11/24/2023 05:40 AM   Modules accepted: Orders

## 2024-01-15 ENCOUNTER — Encounter (INDEPENDENT_AMBULATORY_CARE_PROVIDER_SITE_OTHER): Admitting: Ophthalmology

## 2024-01-15 ENCOUNTER — Ambulatory Visit: Payer: Self-pay

## 2024-01-15 DIAGNOSIS — H353132 Nonexudative age-related macular degeneration, bilateral, intermediate dry stage: Secondary | ICD-10-CM | POA: Diagnosis not present

## 2024-01-15 DIAGNOSIS — H59032 Cystoid macular edema following cataract surgery, left eye: Secondary | ICD-10-CM | POA: Diagnosis not present

## 2024-01-15 DIAGNOSIS — H43813 Vitreous degeneration, bilateral: Secondary | ICD-10-CM | POA: Diagnosis not present

## 2024-01-15 NOTE — Telephone Encounter (Signed)
 FYI Only or Action Required?: FYI only for provider.  Patient was last seen in primary care on 11/23/2023 by Copland, Harlene BROCKS, MD.  Called Nurse Triage reporting Toe Pain.  Symptoms began several months ago.  Interventions attempted: elevation.  Symptoms are: gradually worsening.  Triage Disposition: No disposition on file.  Patient/caregiver understands and will follow disposition?:        Copied from CRM [id number]. Topic: Clinical - Red Word Triage >> Jan 15, 2024  3:48 PM Suzen RAMAN wrote: Red Word that prompted transfer to Nurse Triage: ongoing Pain in big toe Reason for Disposition  [1] MILD pain (e.g., does not interfere with normal activities) AND [2] present > 7 days  Answer Assessment - Initial Assessment Questions 1. ONSET: When did the pain start?      Off and on about 8-9 months 2. LOCATION: Where is the pain located?   (e.g., around nail, entire toe, at foot joint)      Big toe on right foot but also going up foot.  3. PAIN: How bad is the pain?    (Scale 1-10; or mild, moderate, severe)     7/10 4. APPEARANCE: What does the toe look like? (e.g., redness, swelling, bruising, pallor)     Swelling into about half way of the foot 5. CAUSE: What do you think is causing the toe pain?     Husband dropped hammer on toe about 9 months  6. OTHER SYMPTOMS: Do you have any other symptoms? (e.g., leg pain, rash, fever, numbness)     Numbness about 3-4 months ago  Protocols used: Toe Pain-A-AH

## 2024-01-15 NOTE — Telephone Encounter (Signed)
Has appt 8/13

## 2024-01-21 NOTE — Patient Instructions (Incomplete)
 It was good to see you today, be sure to get your flu shot and a COVID booster later this fall  Please stop by imaging on the ground floor to have an x-ray of your toe Let's start on amlodipine  2.5 mg daily for your BP; please let me know how your BP looks at home over the next few weeks.  I would like to see your number running: 120- 135/ 70- 85

## 2024-01-21 NOTE — Progress Notes (Signed)
 [facility] at [facility] [address] [place], [place] [address] [phone]  Date:  01/24/2024   Name:  Courtney Ayala   DOB:  1940/07/27   MRN:  [id number]  PCP:  Watt Harlene BROCKS, MD    Chief Complaint: Toe Pain (Injury 2024)   History of Present Illness:  Courtney Ayala is a 83 y.o. very pleasant female patient who presents with the following:  Patient seen today with a concern about her toe.  I last saw [place] in [place] that time she noted a hammer was accidentally dropped on her right great toe about 6 months previously This occurred about 9 months ago now She notes she may wake up sometimes at night with pain in the toe-it still bothers her.  She never did get an x-ray and would like to do this now  History of hypothyroidism, osteoporosis, dyslipidemia, prediabetes, B12 deficiency   Mammogram-she declines at this time Recommend flu shot, COVID booster this fall Recommend Shingrix  Her brother just died last night- he was only 85 years old.  Pt notes she has not slept well due to this the last few days and thinks this may be why her blood pressure is high Erricka is one of 12 siblings  Pt is interested in being screened for colon cancer-we discussed this together.  I brought this up when I saw her a few years ago and ordered Cologuard but it was not completed.  At that time she recalled having had a colonoscopy at some point in the past but was not sure exactly when.  I discussed the fact that she is on the older side for colon cancer screening at this point.  However she is certainly in good health and very vigorous for her age.  She strongly wishes to be screened and we agreed upon a Cologuard  Wt Readings from Last 3 Encounters:  01/24/24 125 lb 3.2 oz (56.8 kg)  11/23/23 125 lb 9.6 oz (57 kg)  05/29/23 131 lb 6 oz (59.6 kg)  Weight one year ago 131  Pt is concerned about some weight loss- she feels like she is eating about  the same as she did when she was younger but has lost about 5 pounds.  She does try to eat a healthy diet  She notes she has been slender her whole life She otherwise feels well  BP Readings from Last 3 Encounters:  01/24/24 (!) 160/80  11/23/23 130/82  05/29/23 (!) 153/76   180/80 BP on recheck, then 160/80  Pulse Readings from Last 3 Encounters:  01/24/24 60  11/23/23 60  05/29/23 60   She notes she may be occasionally lightheaded for a moment -otherwise no symptoms such as chest pain, shortness of breath, headaches or vision change  Looking back we noted bradycardia previously and I did do a Zio patch 2 years ago   Predominant underlying rhythm is normal sinus rhythm with average heart rate 59 bpm.   8 instances of SVT with longest lasting 10.9 seconds at an average rate of 133 bpm.  Fastest was 154 bpm for 4 beats.   No atrial fibrillation   Ventricular ectopic ectopy less than 1% burden. Patient Active Problem List   Diagnosis Date Noted   Knee osteoarthritis 06/13/2017   Osteoporosis 06/12/2017   Pre-diabetes 08/27/2016   Postablative hypothyroidism 05/09/2016   Dyslipidemia 05/09/2016   Pelvic organ prolapse quantification stage 1 cystocele 03/30/2016  Past Medical History:  Diagnosis Date   Arthritis    B/L knees   Hyperlipidemia    Neuromuscular disorder (HCC)    polymyalgia   Osteoporosis 06/12/2017   Thyroid  disease     Past Surgical History:  Procedure Laterality Date   cataract surgery     ECTOPIC PREGNANCY SURGERY  1972    Social History   Tobacco Use   Smoking status: Never   Smokeless tobacco: Never  Vaping Use   Vaping status: Never Used  Substance Use Topics   Alcohol use: No   Drug use: No    Family History  Problem Relation Age of Onset   Hypertension Maternal Grandmother    Cancer Neg Hx    Stroke Neg Hx    Diabetes Neg Hx    Colon cancer Neg Hx    Breast cancer Neg Hx     No Known Allergies  Medication list has been  reviewed and updated.  Current Outpatient Medications on File Prior to Visit  Medication Sig Dispense Refill   atorvastatin  (LIPITOR) 10 MG tablet Take 1 tablet (10 mg total) by mouth daily. 90 tablet 3   azelastine  (ASTELIN ) 0.1 % nasal spray Place 1 spray into both nostrils 2 (two) times daily. Use in each nostril as directed (Patient not taking: Reported on 11/23/2023) 30 mL 0   Bromfenac Sodium (PROLENSA) 0.07 % SOLN Apply to eye in the morning and at bedtime.     Calcium  Carbonate (CALCIUM  600 PO) Take 600 mg by mouth.     CRANBERRY PO Take by mouth.     estradiol  (ESTRACE ) 0.1 MG/GM vaginal cream PLACE 0.5 GRAMS VAGINALLY TWICE WEEKLY (Patient not taking: Reported on 11/23/2023) 42.5 g 2   Ferrous Sulfate (IRON) 325 (65 Fe) MG TABS Take by mouth.     levothyroxine  (SYNTHROID ) 88 MCG tablet Take 1 tablet (88 mcg total) by mouth daily before breakfast. 90 tablet 3   Omega-3 Fatty Acids (FISH OIL) 1000 MG CAPS Take 1,000 mg by mouth.     vitamin B-12 (CYANOCOBALAMIN ) 100 MCG tablet Take 100 mcg by mouth daily.     No current facility-administered medications on file prior to visit.    Review of Systems:  As per HPI- otherwise negative.   Physical Examination: Vitals:   01/24/24 0830 01/24/24 1013  BP: (!) 190/100 (!) 160/80  Pulse: 60   Resp: 18   Temp: 98 F (36.7 C)   SpO2: 99%    Vitals:   01/24/24 0830  Weight: 125 lb 3.2 oz (56.8 kg)  Height: 5' 7 (1.702 m)   Body mass index is 19.61 kg/m. Ideal Body Weight: Weight in (lb) to have BMI = 25: 159.3  GEN: no acute distress.  Slender build, looks well HEENT: Atraumatic, Normocephalic.  Bilateral TM wnl, oropharynx normal.  PEERL,EOMI.   Ears and Nose: No external deformity. CV: RRR, No M/G/R. No JVD. No thrill. No extra heart sounds. PULM: CTA B, no wheezes, crackles, rhonchi. No retractions. No resp. distress. No accessory muscle use. ABD: S, NT, ND, +BS. No rebound. No HSM. EXTR: No c/c/e PSYCH: Normally  interactive. Conversant.  Exam of her right foot is normal.  I am not able to reproduce tenderness by pressing on or moving her right great toe.  No swelling, pulses are normal.  Mild bunion formation right side  EKG: Sinus bradycardia with rate of 49, otherwise normal.  Compared with EKG from 2 years ago no concerning changes are noted Assessment  and Plan: Screening for colon cancer - Plan: Cologuard  Pain of right great toe - Plan: DG Toe Great Right  Hypertension, unspecified type - Plan: EKG 12-Lead, amLODipine  (NORVASC ) 2.5 MG tablet, DISCONTINUED: amLODipine  (NORVASC ) 2.5 MG tablet  Bradycardia  Patient seen today for follow-up.  As above, she strongly wishes to be screened for colon cancer.  I ordered a Cologuard kit for her today-she understands if positive we may recommend a colonoscopy She has dealt with pain in her right great toe since an injury about 9 months ago.  Ordered an x-ray from today Noted hypertension today as well as bradycardia.  Elevated blood pressure may be due to stress, she lost her brother just yesterday.  However we have seen elevation before and her first blood pressure reading today was dramatically high.  Will have her start amlodipine  2.5 and she will keep an eye on her blood pressures from the at home  We discussed bradycardia noted on EKG.  She is asymptomatic and did a Zio patch 2 years ago for same.  She will let me know if any changes or symptoms occur  Signed Harlene Schroeder, MD  Received her toe x-ray, message to patient  DG Toe Great Right Result Date: 01/24/2024 CLINICAL DATA:  Right great toe pain after injury 9 months ago. EXAM: RIGHT GREAT TOE COMPARISON:  None Available. FINDINGS: There is no evidence of fracture or dislocation. There is no evidence of arthropathy or other focal bone abnormality. Soft tissues are unremarkable. IMPRESSION: Negative. Electronically Signed   By: Lynwood Landy Raddle M.D.   On: 01/24/2024 10:35

## 2024-01-24 ENCOUNTER — Ambulatory Visit (HOSPITAL_BASED_OUTPATIENT_CLINIC_OR_DEPARTMENT_OTHER)
Admission: RE | Admit: 2024-01-24 | Discharge: 2024-01-24 | Disposition: A | Discharge status: Home / Self Care | Source: Ambulatory Visit | Attending: Family Medicine | Admitting: Family Medicine

## 2024-01-24 ENCOUNTER — Other Ambulatory Visit (HOSPITAL_BASED_OUTPATIENT_CLINIC_OR_DEPARTMENT_OTHER): Payer: Self-pay

## 2024-01-24 ENCOUNTER — Encounter: Payer: Self-pay | Admitting: Family Medicine

## 2024-01-24 ENCOUNTER — Ambulatory Visit (INDEPENDENT_AMBULATORY_CARE_PROVIDER_SITE_OTHER): Admitting: Family Medicine

## 2024-01-24 VITALS — BP 160/80 | HR 60 | Temp 98.0°F | Resp 18 | Ht 67.0 in | Wt 125.2 lb

## 2024-01-24 DIAGNOSIS — M79674 Pain in right toe(s): Secondary | ICD-10-CM | POA: Diagnosis present

## 2024-01-24 DIAGNOSIS — Z1211 Encounter for screening for malignant neoplasm of colon: Secondary | ICD-10-CM | POA: Diagnosis not present

## 2024-01-24 DIAGNOSIS — I10 Essential (primary) hypertension: Secondary | ICD-10-CM | POA: Diagnosis not present

## 2024-01-24 DIAGNOSIS — R001 Bradycardia, unspecified: Secondary | ICD-10-CM | POA: Diagnosis not present

## 2024-01-24 MED ORDER — AMLODIPINE BESYLATE 2.5 MG PO TABS: 2.5000 mg | ORAL_TABLET | Freq: Every day | ORAL | 3 refills | Status: DC

## 2024-01-24 MED ORDER — AMLODIPINE BESYLATE 2.5 MG PO TABS
2.5000 mg | ORAL_TABLET | Freq: Every day | ORAL | 3 refills | Status: DC
  Filled 2024-01-24 (×2): qty 30, 30d supply, fill #0

## 2024-02-04 LAB — COLOGUARD: COLOGUARD: NEGATIVE

## 2024-02-05 ENCOUNTER — Encounter: Payer: Self-pay | Admitting: Family Medicine

## 2024-02-07 ENCOUNTER — Encounter (HOSPITAL_BASED_OUTPATIENT_CLINIC_OR_DEPARTMENT_OTHER): Payer: Self-pay

## 2024-02-07 ENCOUNTER — Emergency Department (HOSPITAL_BASED_OUTPATIENT_CLINIC_OR_DEPARTMENT_OTHER)
Admission: EM | Admit: 2024-02-07 | Discharge: 2024-02-07 | Disposition: A | Discharge status: Home / Self Care | Source: Ambulatory Visit | Attending: Emergency Medicine | Admitting: Emergency Medicine

## 2024-02-07 ENCOUNTER — Other Ambulatory Visit (HOSPITAL_BASED_OUTPATIENT_CLINIC_OR_DEPARTMENT_OTHER): Payer: Self-pay

## 2024-02-07 ENCOUNTER — Other Ambulatory Visit: Payer: Self-pay

## 2024-02-07 DIAGNOSIS — R21 Rash and other nonspecific skin eruption: Secondary | ICD-10-CM | POA: Insufficient documentation

## 2024-02-07 MED ORDER — TRIAMCINOLONE ACETONIDE 0.5 % EX OINT
1.0000 | TOPICAL_OINTMENT | Freq: Two times a day (BID) | CUTANEOUS | 0 refills | Status: DC
  Filled 2024-02-07: qty 30, 15d supply, fill #0

## 2024-02-07 NOTE — ED Triage Notes (Signed)
 Pt reports that she was seen on 08/13 by primary and they started her on a new medication(Amlodipine ). States that since then she has been having some itching on there upper back and lower torso. States that she is also having some itching on right foot.

## 2024-02-07 NOTE — ED Provider Notes (Addendum)
 [facility] Provider Note   CSN: [id number] Arrival date & time: 02/07/24  9160     Patient presents with: Pruritis   Courtney Ayala is a 83 y.o. female.   HPI 83 year old female presents with rash and itching.  For about 1 week she has had rash to her right foot as well as itching to that area and then the back of her neck.  Last night/this morning the symptoms were so itchy that she decided to come to the ER.  She has been trying some topical Benadryl and then some topical hydrocortisone.  No facial swelling, trouble breathing or swallowing, fever.  She worked outside in the yard but this was 2 weeks ago and does not think it is related.  She also was started on amlodipine  2 weeks ago by her PCP.  Prior to Admission medications   Medication Sig Start Date End Date Taking? Authorizing Provider  triamcinolone  ointment (KENALOG ) 0.5 % Apply 1 Application topically 2 (two) times daily. 02/07/24  Yes Freddi Hamilton, MD  amLODipine  (NORVASC ) 2.5 MG tablet Take 1 tablet (2.5 mg total) by mouth daily. 01/24/24   Copland, Harlene BROCKS, MD  atorvastatin  (LIPITOR) 10 MG tablet Take 1 tablet (10 mg total) by mouth daily. 05/29/23   Copland, Harlene BROCKS, MD  azelastine  (ASTELIN ) 0.1 % nasal spray Place 1 spray into both nostrils 2 (two) times daily. Use in each nostril as directed Patient not taking: Reported on 11/23/2023 05/29/23   Drubel, Manuelita, PA-C  Bromfenac Sodium (PROLENSA) 0.07 % SOLN Apply to eye in the morning and at bedtime.    [provider]  Calcium  Carbonate (CALCIUM  600 PO) Take 600 mg by mouth.    [provider]  CRANBERRY PO Take by mouth.    [provider]  estradiol  (ESTRACE ) 0.1 MG/GM vaginal cream PLACE 0.5 GRAMS VAGINALLY TWICE WEEKLY Patient not taking: Reported on 11/23/2023 11/02/22   Copland, Jessica C, MD  Ferrous Sulfate (IRON) 325 (65 Fe) MG TABS Take by mouth.    [provider]   levothyroxine  (SYNTHROID ) 88 MCG tablet Take 1 tablet (88 mcg total) by mouth daily before breakfast. 03/02/23   Copland, Jessica C, MD  Omega-3 Fatty Acids (FISH OIL) 1000 MG CAPS Take 1,000 mg by mouth.    [provider]  vitamin B-12 (CYANOCOBALAMIN ) 100 MCG tablet Take 100 mcg by mouth daily.    [provider]    Allergies: Patient has no known allergies.    Review of Systems  Constitutional:  Negative for fever.  HENT:  Negative for facial swelling.   Respiratory:  Negative for shortness of breath.   Skin:  Positive for rash.    Updated Vital Signs BP (!) 160/72   Pulse (!) 54   Temp 97.6 F (36.4 C) (Oral)   Resp 18   Ht 5' 7 (1.702 m)   Wt 56.7 kg   SpO2 99%   BMI 19.58 kg/m   Physical Exam Vitals and nursing note reviewed.  Constitutional:      Appearance: She is well-developed.  HENT:     Head: Normocephalic and atraumatic.  Cardiovascular:     Rate and Rhythm: Normal rate and regular rhythm.     Pulses:          Dorsalis pedis pulses are 2+ on the right side.  Pulmonary:     Effort: Pulmonary effort is normal.  Skin:    General: Skin is warm  and dry.     Findings: Rash present.     Comments: There is a dark area of discoloration on the medial side of her right dorsal foot.  Seems a little drier than the rest of the skin.  No erythema. To the back of her neck there is no specific area of rash besides perhaps some milder darker discoloration.  Neurological:     Mental Status: She is alert.     (all labs ordered are listed, but only abnormal results are displayed) Labs Reviewed - No data to display  EKG: None  Radiology: No results found.   Procedures   Medications Ordered in the ED - No data to display                                  Medical Decision Making Risk Prescription drug management.   Patient presents with a rash.  Based on the time course I do not think it is related to anything specific from outside or her  amlodipine  as she does not have a worsening rash or diffuse hives to suggest an allergic reaction.  Unclear what is causing this but I think is reasonable to try a stronger steroid cream and recommend antihistamine such as Zyrtec or Claritin.  Otherwise, no signs of anaphylaxis or severe reaction.  Will have her follow-up with her PCP.  This does not appear infectious.  Will give return precautions.     Final diagnoses:  Rash and nonspecific skin eruption    ED Discharge Orders          Ordered    triamcinolone  ointment (KENALOG ) 0.5 %  2 times daily        02/07/24 0919               Freddi Hamilton, MD 02/07/24 [phone]

## 2024-02-07 NOTE — Discharge Instructions (Signed)
 We are giving you a stronger steroid cream, you can stop the hydrocortisone cream.  You may still use the Benadryl cream to help.  It is also recommended that you use Zyrtec or Claritin to help with itching.  Follow-up with your primary care provider.  However if you develop fever, new or worsening rash, trouble breathing or swallowing, or any other new/concerning symptoms then return to the ER.

## 2024-02-14 ENCOUNTER — Ambulatory Visit: Payer: Self-pay

## 2024-02-14 NOTE — Telephone Encounter (Signed)
 Appt scheduled

## 2024-02-14 NOTE — Telephone Encounter (Signed)
 Appointment made for tomorrow 02/15/2024 at 11:20 AM with Waddell Mon NP  FYI Only or Action Required?: FYI only for provider.  Patient was last seen in primary care on 01/24/2024 by Copland, Harlene BROCKS, MD.  Called Nurse Triage reporting Rash.  Symptoms began 2 weeks ago.  Interventions attempted: OTC medications: Benadryl, Hydrocortisone Cream, Prescription medications: Triamcinolone  ointment, and Other: ER visit on 8/27.  Symptoms are: gradually worsening.  Triage Disposition: See Physician Within 24 Hours  Patient/caregiver understands and will follow disposition?: Yes                 Copied from CRM #[id number]. Topic: Clinical - Red Word Triage >> Feb 14, 2024  8:38 AM Jasmin G wrote: Red Word that prompted transfer to Nurse Triage: Pt has been experiencing a rash for a couple of weeks, she went to the ER on 8/27 and was given med to rub but rash is still spreading, please refer to Encounter for more details. Reason for Disposition . SEVERE itching (i.e., interferes with sleep, normal activities or school)  Answer Assessment - Initial Assessment Questions Benadryl, hydrocortisone cream over the counter    triamcinolone  ointment (KENALOG )  from ER visit--helping but rash still present  Patient is advised that if anything worsens to go to the Emergency Room. Patient verbalized understanding.   1. APPEARANCE of RASH: What does the rash look like? (e.g., blisters, dry flaky skin, red spots, redness, sores)     Red, some blisters, one on back has turned into a sore from scratching per patient 2. SIZE: How big are the spots? (e.g., tip of pen, eraser, coin; inches, centimeters)     variable 3. LOCATION: Where is the rash located?     Rash--started on back of neck and shoulders Went to ER on 02/07/2024--given a prescription--helps somewhat but rash is still spreading--spread down to vagina area and on her back near her tailbone--some on foot as well now 4. COLOR:  What color is the rash? (Note: It is difficult to assess rash color in people with darker-colored skin. When this situation occurs, simply ask the caller to describe what they see.)     Red 5. ONSET: When did the rash begin?     About 2 weeks 6. FEVER: Do you have a fever? If Yes, ask: What is your temperature, how was it measured, and when did it start?     no 7. ITCHING: Does the rash itch? If Yes, ask: How bad is the itch? (Scale 1-10; or mild, moderate, severe)     Yes--Severe--wakes her up at night 8. CAUSE: What do you think is causing the rash?     Unsure 9. MEDICINE FACTORS: Have you started any new medicines within the last 2 weeks? (e.g., antibiotics)      Unsure exactly 10. OTHER SYMPTOMS: Do you have any other symptoms? (e.g., dizziness, headache, sore throat, joint pain)       Itching  Protocols used: Rash or Redness - [facility]

## 2024-02-15 ENCOUNTER — Ambulatory Visit (INDEPENDENT_AMBULATORY_CARE_PROVIDER_SITE_OTHER): Admitting: Family Medicine

## 2024-02-15 ENCOUNTER — Encounter: Payer: Self-pay | Admitting: Family Medicine

## 2024-02-15 VITALS — BP 144/65 | HR 60 | Ht 67.0 in | Wt 125.0 lb

## 2024-02-15 DIAGNOSIS — L308 Other specified dermatitis: Secondary | ICD-10-CM

## 2024-02-15 MED ORDER — FAMOTIDINE 10 MG PO TABS: 10.0000 mg | ORAL_TABLET | Freq: Two times a day (BID) | ORAL | 0 refills | Status: AC

## 2024-02-15 MED ORDER — CETIRIZINE HCL 5 MG PO TABS: 5.0000 mg | ORAL_TABLET | Freq: Every day | ORAL | 0 refills | Status: AC

## 2024-02-15 NOTE — Progress Notes (Signed)
 Acute Office Visit  Subjective:     Patient ID: Courtney Ayala, female    DOB: 05/27/1941, 83 y.o.   MRN: [id number]  Chief Complaint  Patient presents with   Rash    Patient is in today for rash.  Discussed the use of AI scribe software for clinical note transcription with the patient, who gave verbal consent to proceed.  History of Present Illness Courtney Ayala is an 83 year old female who presents with a persistent rash.  The rash initially appeared on her back and neck and right foot. It has been persistent and unresponsive to over-the-counter treatments such as Benadryl cream and hydrocortisone. The patient reports that triamcinolone  cream is the only treatment that has helped her rash.  She is concerned about the rash potentially spreading to her vaginal area, as she has noticed some itching to lower abdomen but no rash or swelling in that area yet. She is worried about using the triamcinolone  cream in sensitive areas as it is meant for external use only.  She mentions a change in soap to [place], which occurred after the rash developed, and denies any recent changes in laundry detergent. She recalls working in the yard extensively prior to the rash's appearance but is unsure if this is related.  She has not been taking any antihistamines and is concerned about the rash spreading further.        All review of systems negative except what is listed in the HPI      Objective:    BP (!) 144/65   Pulse 60   Ht 5' 7 (1.702 m)   Wt 125 lb (56.7 kg)   SpO2 99%   BMI 19.58 kg/m    Physical Exam Vitals reviewed.  Constitutional:      Appearance: Normal appearance.  Cardiovascular:     Rate and Rhythm: Normal rate and regular rhythm.     Heart sounds: Normal heart sounds.  Pulmonary:     Effort: Pulmonary effort is normal.     Breath sounds: Normal breath sounds.  Skin:    General: Skin is warm and dry.     Findings: Rash present.     Comments: Mildly erythematous  rash to back, lower abd/pubic area; no hives  Neurological:     Mental Status: She is alert and oriented to person, place, and time.  Psychiatric:        Mood and Affect: Mood normal.        Behavior: Behavior normal.        Thought Content: Thought content normal.        Judgment: Judgment normal.             No results found for any visits on 02/15/24.      Assessment & Plan:   Problem List Items Addressed This Visit   None Visit Diagnoses       Pruritic dermatitis    -  Primary   Relevant Medications   cetirizine  (ZYRTEC ) 5 MG tablet   famotidine  (PEPCID ) 10 MG tablet       Assessment & Plan Rash with pruritus Rash with pruritus on back, neck, and right foot. Triamcinolone  effective, most areas improving. Mild erythematous rash to lower abdomen on previous scar. No current vaginal rash. - Continue triamcinolone  cream on affected areas for up to 10 days/month then take a break.  - Prescribe Zyrtec  at bedtime for 1-2 weeks to prevent rash spread. - Prescribe Pepcid  twice daily for 1-2 weeks for  allergic reaction. - Use gentle lotion for dryness. - Avoid harsh soaps; use gentle, fragrance-free soaps like Dove or [place]. -Patient aware of signs/symptoms requiring further/urgent evaluation.   Hypertension Hypertension with recent addition of amlodipine . Elevated blood pressure during visit, medication not yet taken. Recent stress may contribute. - Monitor blood pressure at home once daily, an hour after taking medication, while relaxed, and record readings. - Contact office next week with recorded blood pressure readings to assess medication effectiveness.       Meds ordered this encounter  Medications   cetirizine  (ZYRTEC ) 5 MG tablet    Sig: Take 1 tablet (5 mg total) by mouth daily.    Dispense:  30 tablet    Refill:  0    Supervising Provider:   DOMENICA BLACKBIRD A [[id number]]   famotidine  (PEPCID ) 10 MG tablet    Sig: Take 1 tablet (10 mg total) by mouth 2  (two) times daily.    Dispense:  30 tablet    Refill:  0    Supervising Provider:   DOMENICA BLACKBIRD A [[id number]]    Return if symptoms worsen or fail to improve.  Waddell KATHEE Mon, NP

## 2024-02-22 ENCOUNTER — Other Ambulatory Visit: Payer: Self-pay | Admitting: Family Medicine

## 2024-02-22 DIAGNOSIS — N3000 Acute cystitis without hematuria: Secondary | ICD-10-CM

## 2024-02-22 DIAGNOSIS — R3915 Urgency of urination: Secondary | ICD-10-CM

## 2024-02-23 ENCOUNTER — Other Ambulatory Visit: Payer: Self-pay | Admitting: Family Medicine

## 2024-02-23 DIAGNOSIS — E890 Postprocedural hypothyroidism: Secondary | ICD-10-CM

## 2024-02-29 ENCOUNTER — Ambulatory Visit: Payer: Self-pay

## 2024-02-29 NOTE — Telephone Encounter (Signed)
 Appt scheduled

## 2024-02-29 NOTE — Telephone Encounter (Signed)
 FYI Only or Action Required?: FYI only for provider.  Patient was last seen in primary care on 02/15/2024 by Almarie Waddell NOVAK, NP.  Called Nurse Triage reporting Pruritis.  Symptoms began several weeks ago.  Interventions attempted: Prescription medications: endorses taking all prescribed meds from last AV.  Symptoms are: gradually worsening.  Triage Disposition: See Physician Within 24 Hours  Patient/caregiver understands and will follow disposition?: Yes      Copied from CRM [id number]. Topic: Clinical - Red Word Triage >> Feb 29, 2024  9:34 AM Franky GRADE wrote: Red Word that prompted transfer to Nurse Triage: Patient was seen on 02/15/2024 due to a rash that has developed, rash has gone away; however, the itchiness feeling has spread throughout her body. Reason for Disposition  SEVERE itching (i.e., interferes with sleep, normal activities or school)  Answer Assessment - Initial Assessment Questions 1. APPEARANCE of RASH: What does the rash look like? (e.g., blisters, dry flaky skin, red spots, redness, sores)     blister 2. SIZE: How big are the spots? (e.g., tip of pen, eraser, coin; inches, centimeters)     Small bumps, red and itchy Varies in size 3. LOCATION: Where is the rash located?     Started at head, now back to foot, vagina 4. COLOR: What color is the rash? (Note: It is difficult to assess rash color in people with darker-colored skin. When this situation occurs, simply ask the caller to describe what they see.)     red 5. ONSET: When did the rash begin?     02/15/24 6. FEVER: Do you have a fever? If Yes, ask: What is your temperature, how was it measured, and when did it start?     denies 7. ITCHING: Does the rash itch? If Yes, ask: How bad is the itch? (Scale 1-10; or mild, moderate, severe)     yes 8. CAUSE: What do you think is causing the rash?     unknown 9. MEDICINE FACTORS: Have you started any new medicines within the last 2 weeks? (e.g.,  antibiotics)      denies 10. OTHER SYMPTOMS: Do you have any other symptoms? (e.g., dizziness, headache, sore throat, joint pain)       denies 11. PREGNANCY: Is there any chance you are pregnant? When was your last menstrual period?       N/a  Protocols used: Rash or Redness - [facility]

## 2024-03-01 ENCOUNTER — Ambulatory Visit: Admitting: Family

## 2024-03-09 NOTE — Progress Notes (Addendum)
 [facility] [address] [place], [place] [address] [phone]  Date:  03/13/2024   Name:  Courtney Ayala   DOB:  1940/06/20   MRN:  [id number]  PCP:  Watt Harlene BROCKS, MD    Chief Complaint: Rash (Itching on R foot onset 01/29/2024 /Rash on arms onset about a week after my last appointment 01/24/2024/Flu shot)   History of Present Illness:  Courtney Ayala is a 83 y.o. very pleasant female patient who presents with the following:  Patient seen today with concern of rash and itching I last saw her in Mid- August History of hypothyroidism, osteoporosis, dyslipidemia, prediabetes, B12 deficiency  She was seen in the emergency room with same on 8/27 and then followed up with my partner Waddell Mon on 9/4-she also noted BP was elevated at this most recent visit Rash with pruritus on back, neck, and right foot. Triamcinolone  effective, most areas improving. Mild erythematous rash to lower abdomen on previous scar. No current vaginal rash. - Continue triamcinolone  cream on affected areas for up to 10 days/month then take a break.  - Prescribe Zyrtec  at bedtime for 1-2 weeks to prevent rash spread. - Prescribe Pepcid  twice daily for 1-2 weeks for allergic reaction. - Use gentle lotion for dryness. - Avoid harsh soaps; use gentle, fragrance-free soaps like Dove or [place]. -Patient aware of signs/symptoms requiring further/urgent evaluation.  Hypertension Hypertension with recent addition of amlodipine . Elevated blood pressure during visit, medication not yet taken. Recent stress may contribute. - Monitor blood pressure at home once daily, an hour after taking medication, while relaxed, and record readings.  Discussed the use of AI scribe software for clinical note transcription with the patient, who gave verbal consent to proceed.  History of Present Illness Courtney Ayala is an 83 year old female who presents with persistent itching  and rash.  The itching began at the end of August and has persisted despite treatment. She initially visited the ER on August 28th, where she was prescribed triamcinolone  ointment, which provided temporary relief. However, the symptoms returned and got worse after the initial improvement.  The rash initially appeared on her back and has since spread to her legs, including below the knee, and her foot. She describes the rash as consisting of red spots, some of which have grown larger over time. She notes that the rash resembles poison ivy but has persisted too long for poison ivy  She started taking amlodipine  for hypertension in August, but she ran out and stopped taking it weeks ago. There has been no improvement in the itching since stopping the medication.  Discussed the possibility of allergic reaction  No previous history of similar symptoms and no other new medications or products aside from the blood pressure pills. No fever, chills, or respiratory symptoms.  BP Readings from Last 3 Encounters:  03/13/24 (!) 145/85  02/15/24 (!) 144/65  02/07/24 (!) 160/72     Patient Active Problem List   Diagnosis Date Noted   Knee osteoarthritis 06/13/2017   Osteoporosis 06/12/2017   Pre-diabetes 08/27/2016   Postablative hypothyroidism 05/09/2016   Dyslipidemia 05/09/2016   Pelvic organ prolapse quantification stage 1 cystocele 03/30/2016    Past Medical History:  Diagnosis Date   Arthritis    B/L knees   Hyperlipidemia    Neuromuscular disorder (HCC)    polymyalgia   Osteoporosis 06/12/2017   Thyroid  disease     Past Surgical History:  Procedure  Laterality Date   cataract surgery     ECTOPIC PREGNANCY SURGERY  1972    Social History   Tobacco Use   Smoking status: Never   Smokeless tobacco: Never  Vaping Use   Vaping status: Never Used  Substance Use Topics   Alcohol use: No   Drug use: No    Family History  Problem Relation Age of Onset   Hypertension Maternal  Grandmother    Cancer Neg Hx    Stroke Neg Hx    Diabetes Neg Hx    Colon cancer Neg Hx    Breast cancer Neg Hx     No Known Allergies  Medication list has been reviewed and updated.  Current Outpatient Medications on File Prior to Visit  Medication Sig Dispense Refill   atorvastatin  (LIPITOR) 10 MG tablet Take 1 tablet (10 mg total) by mouth daily. 90 tablet 3   Bromfenac Sodium (PROLENSA) 0.07 % SOLN Apply to eye in the morning and at bedtime.     Calcium  Carbonate (CALCIUM  600 PO) Take 600 mg by mouth.     cetirizine  (ZYRTEC ) 5 MG tablet Take 1 tablet (5 mg total) by mouth daily. 30 tablet 0   CRANBERRY PO Take by mouth.     estradiol  (ESTRACE ) 0.1 MG/GM vaginal cream PLACE 0.5 GM VAGINALLY TWICE WEEKLY 42.5 g 1   famotidine  (PEPCID ) 10 MG tablet Take 1 tablet (10 mg total) by mouth 2 (two) times daily. 30 tablet 0   Ferrous Sulfate (IRON) 325 (65 Fe) MG TABS Take by mouth.     levothyroxine  (SYNTHROID ) 88 MCG tablet Take 1 tablet (88 mcg total) by mouth daily before breakfast. 90 tablet 1   Omega-3 Fatty Acids (FISH OIL) 1000 MG CAPS Take 1,000 mg by mouth.     triamcinolone  ointment (KENALOG ) 0.5 % Apply 1 Application topically 2 (two) times daily. 30 g 0   vitamin B-12 (CYANOCOBALAMIN ) 100 MCG tablet Take 100 mcg by mouth daily.     No current facility-administered medications on file prior to visit.    Review of Systems: As per HPI- otherwise negative.    Physical Examination: Vitals:   03/13/24 1331 03/13/24 1909  BP: (!) 142/86 (!) 145/85  Pulse: (!) 57   SpO2: 98%    Vitals:   03/13/24 1331  Weight: 127 lb (57.6 kg)  Height: 5' 7 (1.702 m)   Body mass index is 19.89 kg/m. Ideal Body Weight: Weight in (lb) to have BMI = 25: 159.3  GEN: no acute distress.  Slender build, looks well HEENT: Atraumatic, Normocephalic. Bilateral TM wnl, oropharynx normal.  PEERL,EOMI.   Ears and Nose: No external deformity. CV: RRR, No M/G/R. No JVD. No thrill. No extra  heart sounds. PULM: CTA B, no wheezes, crackles, rhonchi. No retractions. No resp. distress. No accessory muscle use. EXTR: No c/c/e PSYCH: Normally interactive. Conversant.  Patient is back displays areas of healing, hyperpigmented erythematous rash.  On her legs she has several discrete, palpable macules which appear consistent with Rous dermatitis.  She has a similar but larger macule on the dorsum of her foot which is pictured below.  This 1 is weeping and very itchy   Assessment and Plan: Rash - Plan: predniSONE (DELTASONE) 20 MG tablet, triamcinolone  cream (KENALOG ) 0.1 %  Itching - Plan: CBC, Comprehensive metabolic panel with GFR, Sedimentation rate, C-reactive protein, Antinuclear Antib (ANA)  Hypertension, unspecified type - Plan: losartan (COZAAR) 25 MG tablet, CBC, Comprehensive metabolic panel with GFR  Need  for influenza vaccination - Plan: Flu vaccine HIGH DOSE PF(Fluzone Trivalent)  Assessment & Plan Chronic pruritic rash with post-inflammatory hyperpigmentation Persistent rash with hyperpigmentation not improved by stopping amlodipine , suggesting non-allergic cause but still consider amlodipine  allergy. Consider autoimmune disorder; unlikely poison ivy due to duration. No systemic symptoms. Hyperpigmentation is temporary post inflammation. - Order blood tests for autoimmune disorders. - Prescribe oral steroids for 10 days. - Prescribe triamcinolone  cream for topical use.  Hypertension Hypertension previously controlled with amlodipine , now slightly elevated.Plan to switch antihypertensive medication. - Discontinue amlodipine . - Starting losartan 25 - Send prescription to [facility].  Signed Harlene Schroeder, MD Addendum 10/2 Received labs as below, message to patient Results for orders placed or performed in visit on 03/13/24  CBC   Collection Time: 03/13/24  2:12 PM  Result Value Ref Range   WBC 3.6 (L) 4.0 - 10.5 K/uL   RBC 4.12 3.87 - 5.11 Mil/uL    Platelets 164.0 150.0 - 400.0 K/uL   Hemoglobin 13.3 12.0 - 15.0 g/dL   HCT 59.7 63.9 - 53.9 %   MCV 97.6 78.0 - 100.0 fl   MCHC 33.1 30.0 - 36.0 g/dL   RDW 86.0 88.4 - 84.4 %  Comprehensive metabolic panel with GFR   Collection Time: 03/13/24  2:12 PM  Result Value Ref Range   Sodium 142 135 - 145 mEq/L   Potassium 4.3 3.5 - 5.1 mEq/L   Chloride 105 96 - 112 mEq/L   CO2 28 19 - 32 mEq/L   Glucose, Bld 94 70 - 99 mg/dL   BUN 16 6 - 23 mg/dL   Creatinine, Ser 9.14 0.40 - 1.20 mg/dL   Total Bilirubin 0.8 0.2 - 1.2 mg/dL   Alkaline Phosphatase 73 39 - 117 U/L   AST 19 0 - 37 U/L   ALT 12 0 - 35 U/L   Total Protein 6.9 6.0 - 8.3 g/dL   Albumin 4.2 3.5 - 5.2 g/dL   GFR 37.30 >39.99 mL/min   Calcium  9.5 8.4 - 10.5 mg/dL  Sedimentation rate   Collection Time: 03/13/24  2:12 PM  Result Value Ref Range   Sed Rate 3 0 - 30 mm/hr  C-reactive protein   Collection Time: 03/13/24  2:12 PM  Result Value Ref Range   CRP <0.5 0.5 - 20.0 mg/dL  Antinuclear Antib (ANA)   Collection Time: 03/13/24  2:12 PM  Result Value Ref Range   Anti Nuclear Antibody (ANA) POSITIVE (A) NEGATIVE  Anti-nuclear ab-titer (ANA titer)   Collection Time: 03/13/24  2:12 PM  Result Value Ref Range   ANA Titer 1 1:80 (H) titer   ANA Pattern 1 Cytoplasmic (A)    Received her ANA, message to pt 10/7-

## 2024-03-13 ENCOUNTER — Ambulatory Visit: Admitting: Family Medicine

## 2024-03-13 VITALS — BP 145/85 | HR 57 | Ht 67.0 in | Wt 127.0 lb

## 2024-03-13 DIAGNOSIS — I10 Essential (primary) hypertension: Secondary | ICD-10-CM | POA: Diagnosis not present

## 2024-03-13 DIAGNOSIS — Z23 Encounter for immunization: Secondary | ICD-10-CM | POA: Diagnosis not present

## 2024-03-13 DIAGNOSIS — R21 Rash and other nonspecific skin eruption: Secondary | ICD-10-CM | POA: Diagnosis not present

## 2024-03-13 DIAGNOSIS — L299 Pruritus, unspecified: Secondary | ICD-10-CM

## 2024-03-13 MED ORDER — PREDNISONE 20 MG PO TABS: ORAL_TABLET | ORAL | 0 refills | Status: DC

## 2024-03-13 MED ORDER — LOSARTAN POTASSIUM 25 MG PO TABS: 25.0000 mg | ORAL_TABLET | Freq: Every day | ORAL | 3 refills | Status: AC

## 2024-03-13 MED ORDER — TRIAMCINOLONE ACETONIDE 0.1 % EX CREA: 1.0000 | TOPICAL_CREAM | Freq: Two times a day (BID) | CUTANEOUS | 0 refills | Status: DC

## 2024-03-13 NOTE — Patient Instructions (Signed)
 Good to see you today- I will be in touch with your labs For your rash- use the triamcinolone  cream as needed up to twice daily for no more than 2 weeks Oral steroids/ prednisone by mouth for 10 days- start tomorrow We will not go back on amlodipine  just in case it was related to your rash For your BP please start instead on losartan 25 mg daily   Please let me know how you do!

## 2024-03-14 ENCOUNTER — Encounter: Payer: Self-pay | Admitting: Family Medicine

## 2024-03-14 LAB — COMPREHENSIVE METABOLIC PANEL WITH GFR
ALT: 12 U/L (ref 0–35)
AST: 19 U/L (ref 0–37)
Albumin: 4.2 g/dL (ref 3.5–5.2)
Alkaline Phosphatase: 73 U/L (ref 39–117)
BUN: 16 mg/dL (ref 6–23)
CO2: 28 meq/L (ref 19–32)
Calcium: 9.5 mg/dL (ref 8.4–10.5)
Chloride: 105 meq/L (ref 96–112)
Creatinine, Ser: 0.85 mg/dL (ref 0.40–1.20)
GFR: 62.69 mL/min (ref >60.00)
Glucose, Bld: 94 mg/dL (ref 70–99)
Potassium: 4.3 meq/L (ref 3.5–5.1)
Sodium: 142 meq/L (ref 135–145)
Total Bilirubin: 0.8 mg/dL (ref 0.2–1.2)
Total Protein: 6.9 g/dL (ref 6.0–8.3)

## 2024-03-14 LAB — CBC
HCT: 40.2 % (ref 36.0–46.0)
Hemoglobin: 13.3 g/dL (ref 12.0–15.0)
MCHC: 33.1 g/dL (ref 30.0–36.0)
MCV: 97.6 fl (ref 78.0–100.0)
Platelets: 164 K/uL (ref 150.0–400.0)
RBC: 4.12 Mil/uL (ref 3.87–5.11)
RDW: 13.9 % (ref 11.5–15.5)
WBC: 3.6 K/uL — ABNORMAL LOW (ref 4.0–10.5)

## 2024-03-14 LAB — C-REACTIVE PROTEIN: CRP: <0.5 mg/dL (ref 0.5–20.0)

## 2024-03-14 LAB — SEDIMENTATION RATE: Sed Rate: 3 mm/h (ref 0–30)

## 2024-03-15 LAB — ANA: Anti Nuclear Antibody (ANA): POSITIVE — AB

## 2024-03-15 LAB — ANTI-NUCLEAR AB-TITER (ANA TITER): ANA Titer 1: 1:80 {titer} — ABNORMAL HIGH

## 2024-03-15 NOTE — Telephone Encounter (Signed)
 Copied from CRM [id number]. Topic: Clinical - Lab/Test Results >> Mar 15, 2024  9:34 AM Montie POUR wrote: Reason for CRM:  I read her the lab results dated 03/14/24. Please have a nurse call her and discuss the results. She has a question about the white cell count- does she need to do anything about it being low? Please Ms Caspers at [phone]

## 2024-03-15 NOTE — Telephone Encounter (Signed)
 I gave her a callback, her question is actually but the next steps regarding her skin rash.  Advised I am hoping the prednisone will resolve her skin problem, she will call and update me on Monday.  If she fails to improve we will plan to potentially get a biopsy and/will refer to dermatology

## 2024-03-15 NOTE — Telephone Encounter (Signed)
 Pt called in, made her aware of results, states she has further questions regarding her WBC.

## 2024-03-19 ENCOUNTER — Encounter: Payer: Self-pay | Admitting: Family Medicine

## 2024-04-14 NOTE — Patient Instructions (Incomplete)
 Good to see you again today Immunizations: Recommend shingrix series, RSV if not done already  Rash is better but not gone-  Please decrease triamcinolone  cream to once daily We will try another round of oral prednisone AND refer to dermatology Please keep me posted about your progress Please see me in February for a recheck and labs  Ok to use your vaginal estrogen as you typically would

## 2024-04-14 NOTE — Progress Notes (Signed)
 [facility] [address], [address] [place], [place] [address] [phone]  Date:  04/17/2024   Name:  Courtney Ayala   DOB:  [birth year]   MRN:  [id number]  PCP:  Watt Harlene BROCKS, MD    Chief Complaint: No chief complaint on file.   History of Present Illness:  Courtney Ayala is a 83 y.o. very pleasant female patient who presents with the following:  Patient seen today for periodic follow-up.  I saw her most recently a month ago to recheck rash and itching History of hypothyroidism, osteoporosis, dyslipidemia, prediabetes, B12 deficiency  We used a course of prednisone for a discrete puritic rash ANA was slightly + at that time  There was some concern that the rash could be due to amlodipine  allergy so we changed her to losartan  Recommend shingrix, RSV Flu and pneumonia are UTD   Discussed the use of AI scribe software for clinical note transcription with the patient, who gave verbal consent to proceed.  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/09/2016   Dyslipidemia 05/09/2016   Pelvic organ prolapse quantification stage 1 cystocele 03/30/2016    Past Medical History:  Diagnosis Date   Arthritis    B/L knees   Hyperlipidemia    Neuromuscular disorder (HCC)    polymyalgia   Osteoporosis 06/12/2017   Thyroid  disease     Past Surgical History:  Procedure Laterality Date   cataract surgery     ECTOPIC PREGNANCY SURGERY  1972    Social History   Tobacco Use   Smoking status: Never   Smokeless tobacco: Never  Vaping Use   Vaping status: Never Used  Substance Use Topics   Alcohol use: No   Drug use: No    Family History  Problem Relation Age of Onset   Hypertension Maternal Grandmother    Cancer Neg Hx    Stroke Neg Hx    Diabetes Neg Hx     Colon cancer Neg Hx    Breast cancer Neg Hx     No Known Allergies  Medication list has been reviewed and updated.  Current Outpatient Medications on File Prior to Visit  Medication Sig Dispense Refill   atorvastatin  (LIPITOR) 10 MG tablet Take 1 tablet (10 mg total) by mouth daily. 90 tablet 3   Bromfenac Sodium (PROLENSA) 0.07 % SOLN Apply to eye in the morning and at bedtime.     Calcium  Carbonate (CALCIUM  600 PO) Take 600 mg by mouth.     cetirizine  (ZYRTEC ) 5 MG tablet Take 1 tablet (5 mg total) by mouth daily. 30 tablet 0   CRANBERRY PO Take by mouth.     estradiol  (ESTRACE ) 0.1 MG/GM vaginal cream PLACE 0.5 GM VAGINALLY TWICE WEEKLY 42.5 g 1   famotidine  (PEPCID ) 10 MG tablet Take 1 tablet (10 mg total) by mouth 2 (two) times daily. 30 tablet 0   Ferrous Sulfate (IRON) 325 (65 Fe) MG TABS Take by mouth.     levothyroxine  (SYNTHROID ) 88 MCG tablet Take 1 tablet (88 mcg total) by mouth daily before breakfast. 90 tablet 1   losartan (COZAAR) 25 MG tablet Take 1 tablet (25 mg total) by mouth daily. 90 tablet 3   Omega-3 Fatty Acids (FISH OIL) 1000 MG CAPS Take 1,000 mg by  mouth.     predniSONE (DELTASONE) 20 MG tablet Take 40 mg by mouth daily for 5 days, then 20 mg by mouth daily for 5 days 15 tablet 0   triamcinolone  cream (KENALOG ) 0.1 % Apply 1 Application topically 2 (two) times daily. 80 g 0   triamcinolone  ointment (KENALOG ) 0.5 % Apply 1 Application topically 2 (two) times daily. 30 g 0   vitamin B-12 (CYANOCOBALAMIN ) 100 MCG tablet Take 100 mcg by mouth daily.     No current facility-administered medications on file prior to visit.    Review of Systems:  As per HPI- otherwise negative.   Physical Examination: There were no vitals filed for this visit. There were no vitals filed for this visit. There is no height or weight on file to calculate BMI. Ideal Body Weight:    GEN: no acute distress. HEENT: Atraumatic, Normocephalic.  Ears and Nose: No external  deformity. CV: RRR, No M/G/R. No JVD. No thrill. No extra heart sounds. PULM: CTA B, no wheezes, crackles, rhonchi. No retractions. No resp. distress. No accessory muscle use. ABD: S, NT, ND, +BS. No rebound. No HSM. EXTR: No c/c/e PSYCH: Normally interactive. Conversant.    Assessment and Plan: No diagnosis found.  Assessment & Plan   Signed Harlene Schroeder, MD

## 2024-04-15 ENCOUNTER — Encounter (INDEPENDENT_AMBULATORY_CARE_PROVIDER_SITE_OTHER): Admitting: Ophthalmology

## 2024-04-15 ENCOUNTER — Encounter: Payer: Self-pay | Admitting: Radiology

## 2024-04-15 ENCOUNTER — Ambulatory Visit: Admitting: Family Medicine

## 2024-04-15 DIAGNOSIS — H43813 Vitreous degeneration, bilateral: Secondary | ICD-10-CM

## 2024-04-15 DIAGNOSIS — H353132 Nonexudative age-related macular degeneration, bilateral, intermediate dry stage: Secondary | ICD-10-CM | POA: Diagnosis not present

## 2024-04-15 DIAGNOSIS — H59032 Cystoid macular edema following cataract surgery, left eye: Secondary | ICD-10-CM | POA: Diagnosis not present

## 2024-04-17 ENCOUNTER — Ambulatory Visit (INDEPENDENT_AMBULATORY_CARE_PROVIDER_SITE_OTHER): Admitting: Family Medicine

## 2024-04-17 ENCOUNTER — Encounter: Payer: Self-pay | Admitting: Family Medicine

## 2024-04-17 VITALS — BP 150/90 | HR 54 | Ht 67.0 in | Wt 127.0 lb

## 2024-04-17 DIAGNOSIS — I10 Essential (primary) hypertension: Secondary | ICD-10-CM

## 2024-04-17 DIAGNOSIS — R21 Rash and other nonspecific skin eruption: Secondary | ICD-10-CM | POA: Diagnosis not present

## 2024-04-17 MED ORDER — PREDNISONE 20 MG PO TABS: ORAL_TABLET | ORAL | 0 refills | Status: DC

## 2024-04-17 MED ORDER — TRIAMCINOLONE ACETONIDE 0.1 % EX CREA: 1.0000 | TOPICAL_CREAM | Freq: Every day | CUTANEOUS | 0 refills | Status: AC | PRN

## 2024-05-20 ENCOUNTER — Ambulatory Visit: Admitting: *Deleted

## 2024-05-20 ENCOUNTER — Telehealth: Payer: Self-pay | Admitting: *Deleted

## 2024-05-20 VITALS — BP 148/60 | HR 56 | Temp 97.6°F | Resp 16 | Ht 67.0 in | Wt 131.6 lb

## 2024-05-20 DIAGNOSIS — Z0000 Encounter for general adult medical examination without abnormal findings: Secondary | ICD-10-CM

## 2024-05-20 DIAGNOSIS — L299 Pruritus, unspecified: Secondary | ICD-10-CM

## 2024-05-20 DIAGNOSIS — R21 Rash and other nonspecific skin eruption: Secondary | ICD-10-CM

## 2024-05-20 NOTE — Progress Notes (Signed)
 Chief Complaint  Patient presents with   Medicare Wellness     Subjective:   Courtney Ayala is a 83 y.o. female who presents for a Medicare Annual Wellness Visit.  Visit info / Clinical Intake: Medicare Wellness Visit Type:: Initial Annual Wellness Visit Persons participating in visit and providing information:: patient Medicare Wellness Visit Mode:: In-person (required for WTM) Interpreter Needed?: No Pre-visit prep was completed: yes AWV questionnaire completed by patient prior to visit?: no Living arrangements:: (!) lives alone Patient's Overall Health Status Rating: good Typical amount of pain: some Does pain affect daily life?: (!) yes (sometimes) Are you currently prescribed opioids?: no  Dietary Habits and Nutritional Risks How many meals a day?: 3 Eats fruit and vegetables daily?: yes Most meals are obtained by: preparing own meals In the last 2 weeks, have you had any of the following?: none Diabetic:: no  Functional Status Activities of Daily Living (to include ambulation/medication): Independent Ambulation: Independent Medication Administration: Independent Home Management (perform basic housework or laundry): Independent Manage your own finances?: yes Primary transportation is: driving Concerns about vision?: no *vision screening is required for WTM* (up to date with Norleen Ku) Concerns about hearing?: no  Fall Screening Falls in the past year?: 0 Number of falls in past year: 0 Was there an injury with Fall?: 0 Fall Risk Category Calculator: 0 Patient Fall Risk Level: Low Fall Risk  Fall Risk Patient at Risk for Falls Due to: No Fall Risks Fall risk Follow up: Falls evaluation completed  Home and Transportation Safety: All rugs have non-skid backing?: yes All stairs or steps have railings?: yes Grab bars in the bathtub or shower?: yes (shower has handrail, bathtub does not) Have non-skid surface in bathtub or shower?: yes Good home lighting?:  yes Regular seat belt use?: yes Hospital stays in the last year:: no  Cognitive Assessment Difficulty concentrating, remembering, or making decisions? : no Will 6CIT or Mini Cog be Completed: yes What year is it?: 0 points What month is it?: 0 points Give patient an address phrase to remember (5 components): [address] [place]  About what time is it?: 0 points Count backwards from 20 to 1: 2 points Say the months of the year in reverse: 0 points Repeat the address phrase from earlier: 2 points 6 CIT Score: 4 points  Advance Directives (For Healthcare) Does Patient Have a Medical Advance Directive?: No Would patient like information on creating a medical advance directive?: No - Patient declined  Reviewed/Updated  Reviewed/Updated: Reviewed All (Medical, Surgical, Family, Medications, Allergies, Care Teams, Patient Goals)    Allergies (verified) Patient has no known allergies.   Current Medications (verified) Outpatient Encounter Medications as of 05/20/2024  Medication Sig   atorvastatin  (LIPITOR) 10 MG tablet Take 1 tablet (10 mg total) by mouth daily.   Bromfenac Sodium (PROLENSA) 0.07 % SOLN Apply to eye in the morning and at bedtime.   Calcium  Carbonate (CALCIUM  600 PO) Take 600 mg by mouth.   cetirizine  (ZYRTEC ) 5 MG tablet Take 1 tablet (5 mg total) by mouth daily.   CRANBERRY PO Take by mouth.   estradiol  (ESTRACE ) 0.1 MG/GM vaginal cream PLACE 0.5 GM VAGINALLY TWICE WEEKLY   famotidine  (PEPCID ) 10 MG tablet Take 1 tablet (10 mg total) by mouth 2 (two) times daily.   Ferrous Sulfate (IRON) 325 (65 Fe) MG TABS Take by mouth.   levothyroxine  (SYNTHROID ) 88 MCG tablet Take 1 tablet (88 mcg total) by mouth daily before breakfast.   losartan  (  COZAAR ) 25 MG tablet Take 1 tablet (25 mg total) by mouth daily.   Omega-3 Fatty Acids (FISH OIL) 1000 MG CAPS Take 1,000 mg by mouth.   triamcinolone  cream (KENALOG ) 0.1 % Apply 1 Application topically daily as needed.    vitamin B-12 (CYANOCOBALAMIN ) 100 MCG tablet Take 100 mcg by mouth daily.   [DISCONTINUED] predniSONE  (DELTASONE ) 20 MG tablet Take 40 mg by mouth daily for 5 days, then 20 mg by mouth daily for 5 days   [DISCONTINUED] triamcinolone  ointment (KENALOG ) 0.5 % Apply 1 Application topically 2 (two) times daily. (Patient not taking: Reported on 04/17/2024)   No facility-administered encounter medications on file as of 05/20/2024.    History: Past Medical History:  Diagnosis Date   Arthritis    B/L knees   Hyperlipidemia    Neuromuscular disorder (HCC)    polymyalgia   Osteoporosis 06/12/2017   Thyroid  disease    Past Surgical History:  Procedure Laterality Date   cataract surgery     ECTOPIC PREGNANCY SURGERY  1972   Family History  Problem Relation Age of Onset   Hypertension Maternal Grandmother    Cancer Neg Hx    Stroke Neg Hx    Diabetes Neg Hx    Colon cancer Neg Hx    Breast cancer Neg Hx    Social History   Occupational History   Not on file  Tobacco Use   Smoking status: Never   Smokeless tobacco: Never  Vaping Use   Vaping status: Never Used  Substance and Sexual Activity   Alcohol use: No   Drug use: No   Sexual activity: Not Currently    Birth control/protection: Post-menopausal   Tobacco Counseling Counseling given: Not Answered  SDOH Screenings   Food Insecurity: No Food Insecurity (05/20/2024)  Housing: Low Risk  (05/20/2024)  Transportation Needs: No Transportation Needs (05/20/2024)  Utilities: Not At Risk (05/20/2024)  Depression (PHQ2-9): Low Risk  (05/20/2024)  Physical Activity: Insufficiently Active (05/20/2024)  Social Connections: Socially Integrated (05/20/2024)  Stress: No Stress Concern Present (05/20/2024)  Tobacco Use: Low Risk  (05/20/2024)   See flowsheets for full screening details  Depression Screen PHQ 2 & 9 Depression Scale- Over the past 2 weeks, how often have you been bothered by any of the following problems? Little interest or  pleasure in doing things: 0 Feeling down, depressed, or hopeless (PHQ Adolescent also includes...irritable): 0 PHQ-2 Total Score: 0 Trouble falling or staying asleep, or sleeping too much: 0 Feeling tired or having little energy: 0 Poor appetite or overeating (PHQ Adolescent also includes...weight loss): 0 Feeling bad about yourself - or that you are a failure or have let yourself or your family down: 0 Trouble concentrating on things, such as reading the newspaper or watching television (PHQ Adolescent also includes...like school work): 0 Moving or speaking so slowly that other people could have noticed. Or the opposite - being so fidgety or restless that you have been moving around a lot more than usual: 0 Thoughts that you would be better off dead, or of hurting yourself in some way: 0 PHQ-9 Total Score: 0 If you checked off any problems, how difficult have these problems made it for you to do your work, take care of things at home, or get along with other people?: Not difficult at all     Goals Addressed             This Visit's Progress    To take a trip with all 7  sisiters this year               Objective:    Today's Vitals   05/20/24 0936 05/20/24 1020  BP: (!) 165/87 (!) 148/60  Pulse: (!) 56   Resp: 16   Temp: 97.6 F (36.4 C)   TempSrc: Oral   SpO2: 100%   Weight: 131 lb 9.6 oz (59.7 kg)   Height: 5' 7 (1.702 m)    Body mass index is 20.61 kg/m.  Hearing/Vision screen No results found. Immunizations and Health Maintenance Health Maintenance  Topic Date Due   COVID-19 Vaccine (5 - 2025-26 season) 02/12/2024   Mammogram  05/20/2025 (Originally 04/05/2022)   Zoster Vaccines- Shingrix (1 of 2) 05/20/2025 (Originally 04/12/1989)   Medicare Annual Wellness (AWV)  05/20/2025   DTaP/Tdap/Td (2 - Tdap) 01/28/2031   Pneumococcal Vaccine: 50+ Years  Completed   Influenza Vaccine  Completed   Bone Density Scan  Completed   Meningococcal B Vaccine  Aged Out         Assessment/Plan:  This is a routine wellness examination for [facility].  Patient Care Team: Copland, Harlene BROCKS, MD as PCP - General (Family Medicine) Corene Coy, MD as Consulting Physician (Obstetrics and Gynecology) Alvia Norleen BIRCH, MD as Consulting Physician (Ophthalmology)  I have personally reviewed and noted the following in the patient's chart:   Medical and social history Use of alcohol, tobacco or illicit drugs  Current medications and supplements including opioid prescriptions. Functional ability and status Nutritional status Physical activity Advanced directives List of other physicians Hospitalizations, surgeries, and ER visits in previous 12 months Vitals Screenings to include cognitive, depression, and falls Referrals and appointments  No orders of the defined types were placed in this encounter.  In addition, I have reviewed and discussed with patient certain preventive protocols, quality metrics, and best practice recommendations. A written personalized care plan for preventive services as well as general preventive health recommendations were provided to patient.   Lolita Libra, CMA   05/20/2024   Return in 1 year (on 05/20/2025).  After Visit Summary: (In Person-Printed) AVS printed and given to the patient  Nurse Notes: see phone note

## 2024-05-20 NOTE — Telephone Encounter (Signed)
 Pt had AWV today. She continues to have itching all over. She has completed the Prednisone . She felt the itching was better while on the medication but it continues since it has been stopped. She also has new rash area on the top of her left foot.  She said PCP previously discussed dermatology referral and she is ready/willing to proceed.

## 2024-05-20 NOTE — Patient Instructions (Addendum)
 Courtney Ayala,  Thank you for taking the time for your Medicare Wellness Visit. I appreciate your continued commitment to your health goals. Please review the care plan we discussed, and feel free to reach out if I can assist you further.  Please note that Annual Wellness Visits do not include a physical exam. Some assessments may be limited, especially if the visit was conducted virtually. If needed, we may recommend an in-person follow-up with your provider.  Goal: To take a trip with all 7 sisters.  Ongoing Care Seeing your primary care provider every 3 to 6 months helps us  monitor your health and provide consistent, personalized care.   Dr Watt:  07/22/24 9:20am Medicare AWV:  05/22/25 9:40am, in person  Referrals If a referral was made during today's visit and you haven't received any updates within two weeks, please contact the referred provider directly to check on the status.  Dermatology: Please be looking for information in mychart from the referral department with the Office number and address for you to call and schedule once the referral is placed.  Mammogram:  Let us  know if you decide to continue getting these and we will place the order.  Recommended Screenings:  You will need to get the following vaccines at your local pharmacy: Covid  Health Maintenance  Topic Date Due   Medicare Annual Wellness Visit  Never done   COVID-19 Vaccine (5 - 2025-26 season) 02/12/2024   Breast Cancer Screening  05/20/2025*   Zoster (Shingles) Vaccine (1 of 2) 05/20/2025*   DTaP/Tdap/Td vaccine (2 - Tdap) 01/28/2031   Pneumococcal Vaccine for age over 54  Completed   Flu Shot  Completed   Osteoporosis screening with Bone Density Scan  Completed   Meningitis B Vaccine  Aged Out  *Topic was postponed. The date shown is not the original due date.       05/20/2024    9:42 AM  Advanced Directives  Does Patient Have a Medical Advance Directive? No  Would patient like information on  creating a medical advance directive? No - Patient declined  Once completed and notarized, you may return a copy of your Advanced Directive(s) by either of the following:  Bring a copy of your health care power of attorney and living will to the office to be added to your chart at your convenience. You can mail a copy to [facility] [address] [place], [place] [address] or email to [email or web address]    Vision: Annual vision screenings are recommended for early detection of glaucoma, cataracts, and diabetic retinopathy. These exams can also reveal signs of chronic conditions such as diabetes and high blood pressure.  Dental: Annual dental screenings help detect early signs of oral cancer, gum disease, and other conditions linked to overall health, including heart disease and diabetes.  Please see the attached documents for additional preventive care recommendations.

## 2024-05-21 NOTE — Telephone Encounter (Signed)
 I called but did not reach her at this time.  Left message on machine.  I will certainly put in a dermatology referral but this may take several months.  Please let me know if I can help her in the meantime

## 2024-06-09 ENCOUNTER — Other Ambulatory Visit: Payer: Self-pay | Admitting: Family Medicine

## 2024-06-09 DIAGNOSIS — E890 Postprocedural hypothyroidism: Secondary | ICD-10-CM

## 2024-06-09 DIAGNOSIS — E785 Hyperlipidemia, unspecified: Secondary | ICD-10-CM

## 2024-07-18 NOTE — Progress Notes (Unsigned)
 [facility] [address], [address] [place], [place] [address] [phone]  Date:  07/22/2024   Name:  Courtney Ayala   DOB:  04-12-1941   MRN:  [id number]  PCP:  Watt Harlene BROCKS, MD    Chief Complaint: No chief complaint on file.   History of Present Illness:  Courtney Ayala is a 84 y.o. very pleasant female patient who presents with the following:  Patient seen today for periodic follow-up.  I saw her most recently in November. History of hypothyroidism, osteoporosis, dyslipidemia, prediabetes, B12 deficiency   At her last visit in November she was dealing with an itchy skin rash.  She had lesions in multiple locations but no oral involvement.  I gave her oral prednisone  and made a dermatology referral Her blood pressure was a bit high at her visit in November but we thought this might have been due to this medication dose and not sleeping well Per notes in epic it looks like [facility] tried to reach out to her but were not able to get in touch  Lab Results  Component Value Date   HGBA1C 5.8 (H) 11/23/2023   Lab Results  Component Value Date   TSH 0.45 11/23/2023   Discussed the use of AI scribe software for clinical note transcription with the patient, who gave verbal consent to proceed.  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/09/2016   Dyslipidemia 05/09/2016   Pelvic organ prolapse quantification stage 1 cystocele 03/30/2016    Past Medical History:  Diagnosis Date   Arthritis    B/L knees   Hyperlipidemia    Neuromuscular disorder (HCC)    polymyalgia   Osteoporosis 06/12/2017   Thyroid  disease     Past Surgical History:  Procedure Laterality Date   cataract surgery     ECTOPIC PREGNANCY SURGERY  1972    Social  History[1]  Family History  Problem Relation Age of Onset   Hypertension Maternal Grandmother    Cancer Neg Hx    Stroke Neg Hx    Diabetes Neg Hx    Colon cancer Neg Hx    Breast cancer Neg Hx     Allergies[2]  Medication list has been reviewed and updated.  Medications Ordered Prior to Encounter[3]  Review of Systems:  As per HPI- otherwise negative.   Physical Examination: There were no vitals filed for this visit. There were no vitals filed for this visit. There is no height or weight on file to calculate BMI. Ideal Body Weight:    GEN: no acute distress. HEENT: Atraumatic, Normocephalic.  Ears and Nose: No external deformity. CV: RRR, No M/G/R. No JVD. No thrill. No extra heart sounds. PULM: CTA B, no wheezes, crackles, rhonchi. No retractions. No resp. distress. No accessory muscle use. ABD: S, NT, ND, +BS. No rebound. No HSM. EXTR: No c/c/e PSYCH: Normally interactive. Conversant.    Assessment and Plan: No diagnosis found.  Assessment & Plan   Signed Harlene Watt, MD    [1]  Social History Tobacco Use   Smoking status: Never   Smokeless tobacco: Never  Vaping Use   Vaping status: Never Used  Substance Use Topics   Alcohol use: No   Drug use: No  [2] No Known Allergies [3]  Current  Outpatient Medications on File Prior to Visit  Medication Sig Dispense Refill   atorvastatin  (LIPITOR) 10 MG tablet TAKE 1 TABLET DAILY 90 tablet 3   Bromfenac Sodium (PROLENSA) 0.07 % SOLN Apply to eye in the morning and at bedtime.     Calcium  Carbonate (CALCIUM  600 PO) Take 600 mg by mouth.     cetirizine  (ZYRTEC ) 5 MG tablet Take 1 tablet (5 mg total) by mouth daily. 30 tablet 0   CRANBERRY PO Take by mouth.     estradiol  (ESTRACE ) 0.1 MG/GM vaginal cream PLACE 0.5 GM VAGINALLY TWICE WEEKLY 42.5 g 1   famotidine  (PEPCID ) 10 MG tablet Take 1 tablet (10 mg total) by mouth 2 (two) times daily. 30 tablet 0   Ferrous Sulfate (IRON) 325 (65 Fe) MG TABS Take by  mouth.     losartan  (COZAAR ) 25 MG tablet Take 1 tablet (25 mg total) by mouth daily. 90 tablet 3   Omega-3 Fatty Acids (FISH OIL) 1000 MG CAPS Take 1,000 mg by mouth.     SYNTHROID  88 MCG tablet TAKE 1 TABLET DAILY BEFORE BREAKFAST 90 tablet 3   triamcinolone  cream (KENALOG ) 0.1 % Apply 1 Application topically daily as needed. 80 g 0   vitamin B-12 (CYANOCOBALAMIN ) 100 MCG tablet Take 100 mcg by mouth daily.     No current facility-administered medications on file prior to visit.   "

## 2024-07-22 ENCOUNTER — Ambulatory Visit: Admitting: Family Medicine

## 2024-07-22 DIAGNOSIS — R7303 Prediabetes: Secondary | ICD-10-CM

## 2024-07-22 DIAGNOSIS — E890 Postprocedural hypothyroidism: Secondary | ICD-10-CM

## 2024-08-13 ENCOUNTER — Encounter (INDEPENDENT_AMBULATORY_CARE_PROVIDER_SITE_OTHER): Admitting: Ophthalmology

## 2025-05-22 ENCOUNTER — Ambulatory Visit
